# Patient Record
Sex: Male | Born: 1970 | Race: Black or African American | Hispanic: No | Marital: Married | Smoking: Former smoker
Health system: Southern US, Community
[De-identification: ages and names within clinical notes are randomized; demographics above are authoritative.]

## PROBLEM LIST (undated history)

## (undated) DIAGNOSIS — I10 Essential (primary) hypertension: Secondary | ICD-10-CM

## (undated) DIAGNOSIS — E119 Type 2 diabetes mellitus without complications: Secondary | ICD-10-CM

## (undated) DIAGNOSIS — E785 Hyperlipidemia, unspecified: Secondary | ICD-10-CM

## (undated) DIAGNOSIS — F410 Panic disorder [episodic paroxysmal anxiety] without agoraphobia: Secondary | ICD-10-CM

## (undated) DIAGNOSIS — J45909 Unspecified asthma, uncomplicated: Secondary | ICD-10-CM

## (undated) DIAGNOSIS — T7840XA Allergy, unspecified, initial encounter: Secondary | ICD-10-CM

## (undated) DIAGNOSIS — F419 Anxiety disorder, unspecified: Secondary | ICD-10-CM

## (undated) HISTORY — DX: Allergy, unspecified, initial encounter: T78.40XA

## (undated) HISTORY — DX: Panic disorder (episodic paroxysmal anxiety): F41.0

## (undated) HISTORY — DX: Type 2 diabetes mellitus without complications: E11.9

## (undated) HISTORY — DX: Unspecified asthma, uncomplicated: J45.909

## (undated) HISTORY — DX: Hyperlipidemia, unspecified: E78.5

## (undated) HISTORY — DX: Anxiety disorder, unspecified: F41.9

---

## 2014-06-06 ENCOUNTER — Encounter (HOSPITAL_BASED_OUTPATIENT_CLINIC_OR_DEPARTMENT_OTHER): Payer: Self-pay | Admitting: Emergency Medicine

## 2014-06-06 DIAGNOSIS — Z79899 Other long term (current) drug therapy: Secondary | ICD-10-CM | POA: Insufficient documentation

## 2014-06-06 DIAGNOSIS — I10 Essential (primary) hypertension: Secondary | ICD-10-CM | POA: Insufficient documentation

## 2014-06-06 DIAGNOSIS — Z880 Allergy status to penicillin: Secondary | ICD-10-CM | POA: Insufficient documentation

## 2014-06-06 NOTE — ED Notes (Signed)
Pt states that he has had heart palpitations since he was a teenager but getting worse over the past few weeks

## 2014-06-07 ENCOUNTER — Emergency Department (HOSPITAL_BASED_OUTPATIENT_CLINIC_OR_DEPARTMENT_OTHER)
Admission: EM | Admit: 2014-06-07 | Discharge: 2014-06-07 | Disposition: A | Discharge status: Home / Self Care | Payer: Self-pay | Attending: Emergency Medicine | Admitting: Emergency Medicine

## 2014-06-07 ENCOUNTER — Emergency Department (HOSPITAL_BASED_OUTPATIENT_CLINIC_OR_DEPARTMENT_OTHER): Payer: Self-pay

## 2014-06-07 DIAGNOSIS — I10 Essential (primary) hypertension: Secondary | ICD-10-CM

## 2014-06-07 HISTORY — DX: Essential (primary) hypertension: I10

## 2014-06-07 LAB — BASIC METABOLIC PANEL
ANION GAP: 8 (ref 5–15)
BUN: 11 mg/dL (ref 6–23)
CALCIUM: 9.3 mg/dL (ref 8.4–10.5)
CHLORIDE: 103 mmol/L (ref 96–112)
CO2: 27 mmol/L (ref 19–32)
CREATININE: 1.16 mg/dL (ref 0.50–1.35)
GFR, EST AFRICAN AMERICAN: 87 mL/min — AB (ref >90)
GFR, EST NON AFRICAN AMERICAN: 75 mL/min — AB (ref >90)
Glucose, Bld: 121 mg/dL — ABNORMAL HIGH (ref 70–99)
Potassium: 3.4 mmol/L — ABNORMAL LOW (ref 3.5–5.1)
Sodium: 138 mmol/L (ref 135–145)

## 2014-06-07 LAB — CBC WITH DIFFERENTIAL/PLATELET
BASOS ABS: 0 10*3/uL (ref 0.0–0.1)
Basophils Relative: 0 % (ref 0–1)
EOS PCT: 2 % (ref 0–5)
Eosinophils Absolute: 0.2 10*3/uL (ref 0.0–0.7)
HCT: 43 % (ref 39.0–52.0)
Hemoglobin: 14.9 g/dL (ref 13.0–17.0)
LYMPHS ABS: 2.7 10*3/uL (ref 0.7–4.0)
LYMPHS PCT: 31 % (ref 12–46)
MCH: 30.9 pg (ref 26.0–34.0)
MCHC: 34.7 g/dL (ref 30.0–36.0)
MCV: 89.2 fL (ref 78.0–100.0)
Monocytes Absolute: 0.9 10*3/uL (ref 0.1–1.0)
Monocytes Relative: 10 % (ref 3–12)
NEUTROS ABS: 5.1 10*3/uL (ref 1.7–7.7)
Neutrophils Relative %: 57 % (ref 43–77)
PLATELETS: 187 10*3/uL (ref 150–400)
RBC: 4.82 MIL/uL (ref 4.22–5.81)
RDW: 12.6 % (ref 11.5–15.5)
WBC: 8.9 10*3/uL (ref 4.0–10.5)

## 2014-06-07 LAB — TROPONIN I: Troponin I: <0.03 ng/mL (ref <0.031)

## 2014-06-07 NOTE — Discharge Instructions (Signed)
DASH Eating Plan °DASH stands for "Dietary Approaches to Stop Hypertension." The DASH eating plan is a healthy eating plan that has been shown to reduce high blood pressure (hypertension). Additional health benefits may include reducing the risk of type 2 diabetes mellitus, heart disease, and stroke. The DASH eating plan may also help with weight loss. °WHAT DO I NEED TO KNOW ABOUT THE DASH EATING PLAN? °For the DASH eating plan, you will follow these general guidelines: °· Choose foods with a percent daily value for sodium of less than 5% (as listed on the food label). °· Use salt-free seasonings or herbs instead of table salt or sea salt. °· Check with your health care provider or pharmacist before using salt substitutes. °· Eat lower-sodium products, often labeled as "lower sodium" or "no salt added." °· Eat fresh foods. °· Eat more vegetables, fruits, and low-fat dairy products. °· Choose whole grains. Look for the word "whole" as the first word in the ingredient list. °· Choose fish and skinless chicken or turkey more often than red meat. Limit fish, poultry, and meat to 6 oz (170 g) each day. °· Limit sweets, desserts, sugars, and sugary drinks. °· Choose heart-healthy fats. °· Limit cheese to 1 oz (28 g) per day. °· Eat more home-cooked food and less restaurant, buffet, and fast food. °· Limit fried foods. °· Cook foods using methods other than frying. °· Limit canned vegetables. If you do use them, rinse them well to decrease the sodium. °· When eating at a restaurant, ask that your food be prepared with less salt, or no salt if possible. °WHAT FOODS CAN I EAT? °Seek help from a dietitian for individual calorie needs. °Grains °Whole grain or whole wheat bread. Brown rice. Whole grain or whole wheat pasta. Quinoa, bulgur, and whole grain cereals. Low-sodium cereals. Corn or whole wheat flour tortillas. Whole grain cornbread. Whole grain crackers. Low-sodium crackers. °Vegetables °Fresh or frozen vegetables  (raw, steamed, roasted, or grilled). Low-sodium or reduced-sodium tomato and vegetable juices. Low-sodium or reduced-sodium tomato sauce and paste. Low-sodium or reduced-sodium canned vegetables.  °Fruits °All fresh, canned (in natural juice), or frozen fruits. °Meat and Other Protein Products °Ground beef (85% or leaner), grass-fed beef, or beef trimmed of fat. Skinless chicken or turkey. Ground chicken or turkey. Pork trimmed of fat. All fish and seafood. Eggs. Dried beans, peas, or lentils. Unsalted nuts and seeds. Unsalted canned beans. °Dairy °Low-fat dairy products, such as skim or 1% milk, 2% or reduced-fat cheeses, low-fat ricotta or cottage cheese, or plain low-fat yogurt. Low-sodium or reduced-sodium cheeses. °Fats and Oils °Tub margarines without trans fats. Light or reduced-fat mayonnaise and salad dressings (reduced sodium). Avocado. Safflower, olive, or canola oils. Natural peanut or almond butter. °Other °Unsalted popcorn and pretzels. °The items listed above may not be a complete list of recommended foods or beverages. Contact your dietitian for more options. °WHAT FOODS ARE NOT RECOMMENDED? °Grains °White bread. White pasta. White rice. Refined cornbread. Bagels and croissants. Crackers that contain trans fat. °Vegetables °Creamed or fried vegetables. Vegetables in a cheese sauce. Regular canned vegetables. Regular canned tomato sauce and paste. Regular tomato and vegetable juices. °Fruits °Dried fruits. Canned fruit in light or heavy syrup. Fruit juice. °Meat and Other Protein Products °Fatty cuts of meat. Ribs, chicken wings, bacon, sausage, bologna, salami, chitterlings, fatback, hot dogs, bratwurst, and packaged luncheon meats. Salted nuts and seeds. Canned beans with salt. °Dairy °Whole or 2% milk, cream, half-and-half, and cream cheese. Whole-fat or sweetened yogurt. Full-fat   cheeses or blue cheese. Nondairy creamers and whipped toppings. Processed cheese, cheese spreads, or cheese  curds. Condiments Onion and garlic salt, seasoned salt, table salt, and sea salt. Canned and packaged gravies. Worcestershire sauce. Tartar sauce. Barbecue sauce. Teriyaki sauce. Soy sauce, including reduced sodium. Steak sauce. Fish sauce. Oyster sauce. Cocktail sauce. Horseradish. Ketchup and mustard. Meat flavorings and tenderizers. Bouillon cubes. Hot sauce. Tabasco sauce. Marinades. Taco seasonings. Relishes. Fats and Oils Butter, stick margarine, lard, shortening, ghee, and bacon fat. Coconut, palm kernel, or palm oils. Regular salad dressings. Other Pickles and olives. Salted popcorn and pretzels. The items listed above may not be a complete list of foods and beverages to avoid. Contact your dietitian for more information. WHERE CAN I FIND MORE INFORMATION? National Heart, Lung, and Blood Institute: CablePromo.itwww.nhlbi.nih.gov/health/health-topics/topics/dash/ Document Released: 03/27/2011 Document Revised: 08/22/2013 Document Reviewed: 02/09/2013 [facility] Patient Information 2015 [place], [place]. This information is not intended to replace advice given to you by your health care provider. Make sure you discuss any questions you have with your health care provider.  How to Take Your Blood Pressure HOW DO I GET A BLOOD PRESSURE MACHINE?  You can buy an electronic home blood pressure machine at your local pharmacy. Insurance will sometimes cover the cost if you have a prescription.  Ask your doctor what type of machine is best for you. There are different machines for your arm and your wrist.  If you decide to buy a machine to check your blood pressure on your arm, first check the size of your arm so you can buy the right size cuff. To check the size of your arm:   Use a measuring tape that shows both inches and centimeters.   Wrap the measuring tape around the upper-middle part of your arm. You may need someone to help you measure.   Write down your arm measurement in both inches and  centimeters.   To measure your blood pressure correctly, it is important to have the right size cuff.   If your arm is up to 13 inches (up to 34 centimeters), get an adult cuff size.  If your arm is 13 to 17 inches (35 to 44 centimeters), get a large adult cuff size.    If your arm is 17 to 20 inches (45 to 52 centimeters), get an adult thigh cuff.  WHAT DO THE NUMBERS MEAN?   There are two numbers that make up your blood pressure. For example: 120/80.  The first number (120 in our example) is called the "systolic pressure." It is a measure of the pressure in your blood vessels when your heart is pumping blood.  The second number (80 in our example) is called the "diastolic pressure." It is a measure of the pressure in your blood vessels when your heart is resting between beats.  Your doctor will tell you what your blood pressure should be. WHAT SHOULD I DO BEFORE I CHECK MY BLOOD PRESSURE?   Try to rest or relax for at least 30 minutes before you check your blood pressure.  Do not smoke.  Do not have any drinks with caffeine, such as:  Soda.  Coffee.  Tea.  Check your blood pressure in a quiet room.  Sit down and stretch out your arm on a table. Keep your arm at about the level of your heart. Let your arm relax.  Make sure that your legs are not crossed. HOW DO I CHECK MY BLOOD PRESSURE?  Follow the directions that came with  your machine.  Make sure you remove any tight-fighting clothing from your arm or wrist. Wrap the cuff around your upper arm or wrist. You should be able to fit a finger between the cuff and your arm. If you cannot fit a finger between the cuff and your arm, it is too tight and should be removed and rewrapped.  Some units require you to manually pump up the arm cuff.  Automatic units inflate the cuff when you press a button.  Cuff deflation is automatic in both models.  After the cuff is inflated, the unit measures your blood pressure and  pulse. The readings are shown on a monitor. Hold still and breathe normally while the cuff is inflated.  Getting a reading takes less than a minute.  Some models store readings in a memory. Some provide a printout of readings. If your machine does not store your readings, keep a written record.  Take readings with you to your next visit with your doctor. Document Released: 03/20/2008 Document Revised: 08/22/2013 Document Reviewed: 06/02/2013 [facility]Exit[facility] Patient Information 2015 [place]Care, [place]LLC. This information is not intended to replace advice given to you by your health care provider. Make sure you discuss any questions you have with your health care provider.

## 2014-06-07 NOTE — ED Provider Notes (Signed)
CSN: [id number]     Arrival date & time 06/06/14  2054 History   First MD Initiated Contact with Patient 06/07/14 0032     Chief Complaint  Patient presents with  . Palpitations     (Consider location/radiation/quality/duration/timing/severity/associated sxs/prior Treatment) Patient is a 44 y.o. male presenting with palpitations. The history is provided by the patient.  Palpitations Onset quality:  Sudden Timing:  Unable to specify Progression:  Worsening Chronicity:  Chronic Context: anxiety   Context: not appetite suppressants   Context comment:  Awakens from sleep in a panic also feels bhe wakes himself with his snoring Relieved by:  Nothing Worsened by:  Nothing Ineffective treatments:  None tried Associated symptoms: no back pain, no chest pain, no chest pressure, no cough, no diaphoresis, no hemoptysis, no leg pain, no lower extremity edema, no malaise/fatigue, no nausea, no near-syncope, no numbness, no orthopnea, no PND, no shortness of breath, no syncope, no vomiting and no weakness   Risk factors: no diabetes mellitus, no hx of atrial fibrillation, no hx of PE and no hypercoagulable state     Past Medical History  Diagnosis Date  . Hypertension    History reviewed. No pertinent past surgical history. History reviewed. No pertinent family history. History  Substance Use Topics  . Smoking status: Never Smoker   . Smokeless tobacco: Not on file  . Alcohol Use: Yes    Review of Systems  Constitutional: Negative for fever, malaise/fatigue, diaphoresis and unexpected weight change.  Respiratory: Negative for cough, hemoptysis, choking, chest tightness, shortness of breath and wheezing.   Cardiovascular: Positive for palpitations. Negative for chest pain, orthopnea, leg swelling, syncope, PND and near-syncope.  Gastrointestinal: Negative for nausea and vomiting.  Musculoskeletal: Negative for back pain.  Neurological: Negative for seizures, syncope, facial asymmetry,  speech difficulty, weakness and numbness.  Hematological: Negative for adenopathy. Does not bruise/bleed easily.  All other systems reviewed and are negative.     Allergies  Penicillins  Home Medications   Prior to Admission medications   Medication Sig Start Date End Date Taking? Authorizing Provider  hydrochlorothiazide (HYDRODIURIL) 25 MG tablet Take 25 mg by mouth daily.   Yes Historical Provider, MD  metoprolol tartrate (LOPRESSOR) 25 MG tablet Take 50 mg by mouth 2 (two) times daily.   Yes Historical Provider, MD   BP 160/104 mmHg  Pulse 84  Resp 20  Ht 6' (1.829 m)  Wt 219 lb (99.338 kg)  BMI 29.70 kg/m2  SpO2 98% Physical Exam  Constitutional: He is oriented to person, place, and time. He appears well-developed and well-nourished. No distress.  HENT:  Head: Normocephalic and atraumatic.  Mouth/Throat: Oropharynx is clear and moist.  Eyes: EOM are normal. Pupils are equal, round, and reactive to light.  Neck: Normal range of motion. Neck supple.  No supraclavicular or axillary LAN  Cardiovascular: Normal rate, regular rhythm and intact distal pulses.   Pulmonary/Chest: Effort normal and breath sounds normal. No stridor. No respiratory distress. He has no wheezes. He has no rales. He exhibits no tenderness.  Abdominal: Soft. Bowel sounds are normal. There is no tenderness. There is no rebound and no guarding.  Musculoskeletal: Normal range of motion. He exhibits no edema or tenderness.  Lymphadenopathy:    He has no cervical adenopathy.  Neurological: He is alert and oriented to person, place, and time. He has normal reflexes. He displays normal reflexes. No cranial nerve deficit. He exhibits normal muscle tone.  Skin: Skin is warm and dry.  ED Course  Procedures (including critical care time) Labs Review Labs Reviewed  CBC WITH DIFFERENTIAL/PLATELET  BASIC METABOLIC PANEL  TROPONIN I    Imaging Review Dg Chest 2 View  06/07/2014   CLINICAL DATA:  Initial  evaluation for shortness of Breath. History of hypertension.  EXAM: CHEST  2 VIEW  COMPARISON:  None.  FINDINGS: The cardiac and mediastinal silhouettes are within normal limits.  The lungs are normally inflated. Mild elevation of left hemidiaphragm noted. No airspace consolidation, pleural effusion, or pulmonary edema is identified. There is no pneumothorax.  No acute osseous abnormality identified.  IMPRESSION: No active cardiopulmonary disease.   Electronically Signed   By: Rise Mu M.D.   On: 06/07/2014 01:14     EKG Interpretation   Date/Time:  Tuesday June 06 2014 21:11:50 EST Ventricular Rate:  89 PR Interval:  160 QRS Duration: 80 QT Interval:  362 QTC Calculation: 440 R Axis:   78 Text Interpretation:  Normal sinus rhythm Normal ECG Sinus rhythm Normal  ECG Confirmed by Gerhard Munch  MD ([id number]) on 06/06/2014 9:15:51 PM      MDM   Final diagnoses:  Hypertension      Visual Acuity  Right Eye Distance: 20/25 Left Eye Distance: 20/25 Bilateral Distance: 20/25 (Patient wears perscription lenses)  Right Eye Near:   Left Eye Near:    Bilateral Near:     PERC negative wells 0. Ruled out for MI based on labs and normal EKG no PVC or arrhythmia on monitor.  Will need to see PMD for BP med adjustment and annual exam.  Strict chest pain return precautions given.    Jasmine Awe, MD 06/07/14 [phone]

## 2014-06-19 ENCOUNTER — Ambulatory Visit (INDEPENDENT_AMBULATORY_CARE_PROVIDER_SITE_OTHER): Payer: Self-pay | Admitting: Family Medicine

## 2014-06-19 ENCOUNTER — Encounter: Payer: Self-pay | Admitting: Family Medicine

## 2014-06-19 ENCOUNTER — Ambulatory Visit: Payer: Self-pay | Admitting: Family Medicine

## 2014-06-19 VITALS — BP 138/90 | HR 73 | Temp 99.4°F | Resp 16 | Ht 71.75 in | Wt 213.0 lb

## 2014-06-19 DIAGNOSIS — F410 Panic disorder [episodic paroxysmal anxiety] without agoraphobia: Secondary | ICD-10-CM

## 2014-06-19 DIAGNOSIS — I10 Essential (primary) hypertension: Secondary | ICD-10-CM

## 2014-06-19 DIAGNOSIS — E785 Hyperlipidemia, unspecified: Secondary | ICD-10-CM

## 2014-06-19 HISTORY — DX: Essential (primary) hypertension: I10

## 2014-06-19 MED ORDER — ALPRAZOLAM 0.25 MG PO TABS: 0.2500 mg | ORAL_TABLET | Freq: Two times a day (BID) | ORAL | Status: DC | PRN

## 2014-06-19 NOTE — Patient Instructions (Signed)
Please keep in touch with me regarding your blood pressure.  Hopefully with continued diet and exercise changes we will see your BP continue to come down.   We would like to see your BP around 130/85 or less- let me know if it continued to run higher than this Please come by for a blood test only in about 3-4 months (fasting please) and we will follow-up your cholesterol  You may use the xanax as needed for panic attacks- of course the less you use this the better, but it is there  If you need it.

## 2014-06-19 NOTE — Progress Notes (Signed)
[facility] [address], [place] [address] [phone]  Date:  06/19/2014   Name:  Cameron Wells   DOB:  05-09-1970   MRN:  [id number]  PCP:  No primary care provider on file.    Chief Complaint: Establish Care and Hypertension   History of Present Illness:  Cameron FolkOmah Tremblay is a 44 y.o. very pleasant male patient who presents with the following:  Here today to establish care as a new patient.  He was seen in the ER on 2/17 with palpitations.  He was ruled out for MI based on labs and normal EKG.  He was asked to see PCP to discuss his BP/ health maintenance.    He is on HCTZ and lopressor 25; he was on these meds at his ER visit.  He has had HTN for about 6 years now.  He is really watching his diet right now and avoiding salt- this is tough but he is really trying and has already lost some weight since his ED visit.  He was told by his last PCP that he might need a statin- however he has been afraid to try this.  He is not sure what his numbers were but he will try to get this to me.  He does have a copy at home.    He feels that his anxiety/ panic is a lot better since he was in the hospital.  He has had this problem off and on for a while.  He has never tried anything for panic but would be ok with having a prn xanax on hand for emergencies.    He has a serious GF, they have a 8010 month old daughter together.    BP Readings from Last 3 Encounters:  06/19/14 138/90  06/07/14 164/105     There are no active problems to display for this patient.   Past Medical History  Diagnosis Date  . Hypertension   . Allergy   . Asthma   . Anxiety     History reviewed. No pertinent past surgical history.  History  Substance Use Topics  . Smoking status: Former Games developermoker  . Smokeless tobacco: Not on file  . Alcohol Use: 0.0 oz/week    0 Standard drinks or equivalent per week    History reviewed. No pertinent family history.  Allergies  Allergen Reactions  .  Penicillins Anaphylaxis    Medication list has been reviewed and updated.  Current Outpatient Prescriptions on File Prior to Visit  Medication Sig Dispense Refill  . hydrochlorothiazide (HYDRODIURIL) 25 MG tablet Take 25 mg by mouth daily.    . metoprolol tartrate (LOPRESSOR) 25 MG tablet Take 50 mg by mouth 2 (two) times daily.     No current facility-administered medications on file prior to visit.    Review of Systems:  As per HPI- otherwise negative.   Physical Examination: Filed Vitals:   06/19/14 1044  BP: 138/90  Pulse: 73  Temp: 99.4 F (37.4 C)  Resp: 16   Filed Vitals:   06/19/14 1044  Height: 5' 11.75" (1.822 m)  Weight: 213 lb (96.616 kg)   Body mass index is 29.1 kg/(m^2). Ideal Body Weight: Weight in (lb) to have BMI = 25: 182.7  GEN: WDWN, NAD, Non-toxic, A & O x 3, overweight, looks well HEENT: Atraumatic, Normocephalic. Neck supple. No masses, No LAD. Ears and Nose: No external deformity. CV: RRR, No M/G/R. No JVD. No thrill. No extra heart sounds. PULM:  CTA B, no wheezes, crackles, rhonchi. No retractions. No resp. distress. No accessory muscle use. EXTR: No c/c/e NEURO Normal gait.  PSYCH: Normally interactive. Conversant. Not depressed or anxious appearing.  Calm demeanor.    Assessment and Plan: Essential hypertension  Panic attack - Plan: ALPRAZolam (XANAX) 0.25 MG tablet  Dyslipidemia - Plan: Lipid panel  BP is acceptable.  He is losing weight and watching his diet.  Will leave his medications as are for now.  He will come by for fasting lipids in 3-4 months (lab visit only) and will keep me posted about home BP via mychart.  He is not able to afford insurance right now although he has looked into his options Gave #10 xanax to have on hand in case of panic attack.  He will use these conservatively.   Signed Abbe Amsterdam, MD

## 2014-08-10 ENCOUNTER — Ambulatory Visit: Payer: Self-pay

## 2014-08-10 ENCOUNTER — Ambulatory Visit (INDEPENDENT_AMBULATORY_CARE_PROVIDER_SITE_OTHER): Payer: Self-pay

## 2014-08-10 ENCOUNTER — Ambulatory Visit (INDEPENDENT_AMBULATORY_CARE_PROVIDER_SITE_OTHER): Payer: Self-pay | Admitting: Family Medicine

## 2014-08-10 VITALS — BP 140/108 | HR 98 | Temp 98.3°F | Resp 16 | Ht 71.75 in | Wt 215.0 lb

## 2014-08-10 DIAGNOSIS — R0602 Shortness of breath: Secondary | ICD-10-CM

## 2014-08-10 DIAGNOSIS — F410 Panic disorder [episodic paroxysmal anxiety] without agoraphobia: Secondary | ICD-10-CM

## 2014-08-10 DIAGNOSIS — F4323 Adjustment disorder with mixed anxiety and depressed mood: Secondary | ICD-10-CM

## 2014-08-10 MED ORDER — ALBUTEROL SULFATE HFA 108 (90 BASE) MCG/ACT IN AERS: 2.0000 | INHALATION_SPRAY | Freq: Four times a day (QID) | RESPIRATORY_TRACT | Status: DC | PRN

## 2014-08-10 MED ORDER — ALPRAZOLAM 0.5 MG PO TABS: 0.2500 mg | ORAL_TABLET | Freq: Two times a day (BID) | ORAL | Status: DC | PRN

## 2014-08-10 MED ORDER — FLUOXETINE HCL 20 MG PO TABS: 20.0000 mg | ORAL_TABLET | Freq: Every day | ORAL | Status: DC

## 2014-08-10 NOTE — Patient Instructions (Signed)
We will start you on prozac for more persistent anxiety. Take the 20 mg a day- then increase to 40 mg after 2 weeks Continue to use the xanax as needed; remember this can be habit forming  Try the albuterol as needed for your shortness of breath.  If this does not help however please let me know and we may order that D dimer test

## 2014-08-10 NOTE — Progress Notes (Signed)
[facility] [address], [place] [address] [phone]  Date:  08/10/2014   Name:  Cameron Wells   DOB:  1971-02-13   MRN:  [id number]  PCP:  No primary care provider on file.    Chief Complaint: Shortness of Breath   History of Present Illness:  Cameron Wells is a 44 y.o. very pleasant male patient who presents with the following:  History of HTN.  Here today with illness.  His BP is looking much better with his medication but still can sometimes be a bit hihg He is also using xanax on occasion for panic.  This does seem to help him a lot; however he only received #10 from the ER and has used them up.  He is generally taking 2 (total of 0.5mg )  He notes some difficulty with his breathing for the last couple of weeks-  "my lungs feel like there is fluid" in them.  This seems to get worse at night when he lays down but can happen other times as well.  He also feels more SOB with tasks at work.  This seems different than his usual anxiety sx.  Admits that he is feeling anxious most of the time recently.  He does not feel that he is depressed.  He described a history of anxiety really his whole life off and on.  "when I was a teenager my mom wanted to have me on xanax."  He has never used any other medication for anxiety or depression He is not coughing.   He is a former smoker- quit in 2011.  He does vape still however.  He has not noted any wheezing.   He feels like his lungs are "full of air already and it is hard to get the air out to take another breath."     No fever.  He did have a ST yesterday.   He has noted some sneezing, itchy eyes and runny nose, he is using allegra D daily.   He has no history of DVT or PE; he is not aware of any family history of clot, no travel recently.    BP Readings from Last 3 Encounters:  08/10/14 140/98  06/19/14 138/90  06/07/14 164/105      Patient Active Problem List   Diagnosis Date Noted  . Essential hypertension  06/19/2014    Past Medical History  Diagnosis Date  . Hypertension   . Allergy   . Asthma   . Anxiety     No past surgical history on file.  History  Substance Use Topics  . Smoking status: Former Games developermoker  . Smokeless tobacco: Not on file  . Alcohol Use: 0.0 oz/week    0 Standard drinks or equivalent per week    No family history on file.  Allergies  Allergen Reactions  . Penicillins Anaphylaxis    Medication list has been reviewed and updated.  Current Outpatient Prescriptions on File Prior to Visit  Medication Sig Dispense Refill  . ALPRAZolam (XANAX) 0.25 MG tablet Take 1 tablet (0.25 mg total) by mouth 2 (two) times daily as needed for anxiety. 10 tablet 0  . hydrochlorothiazide (HYDRODIURIL) 25 MG tablet Take 25 mg by mouth daily.    . metoprolol tartrate (LOPRESSOR) 25 MG tablet Take 50 mg by mouth 2 (two) times daily.     No current facility-administered medications on file prior to visit.    Review of Systems:  As per HPI- otherwise negative.  Physical Examination: Filed Vitals:   08/10/14 1250  BP: 140/98  Pulse: 82  Temp: 98.3 F (36.8 C)  Resp: 16   Filed Vitals:   08/10/14 1250  Height: 5' 11.75" (1.822 m)  Weight: 215 lb (97.523 kg)   Body mass index is 29.38 kg/(m^2). Ideal Body Weight: Weight in (lb) to have BMI = 25: 182.7  GEN: WDWN, NAD, Non-toxic, A & O x 3, large build/ overweight.  Looks well HEENT: Atraumatic, Normocephalic. Neck supple. No masses, No LAD.  Bilateral TM wnl, oropharynx normal.  PEERL,EOMI.   Ears and Nose: No external deformity. CV: RRR, No M/G/R. No JVD. No thrill. No extra heart sounds. PULM: CTA B, no wheezes, crackles, rhonchi. No retractions. No resp. distress. No accessory muscle use. EXTR: No c/c/e NEURO Normal gait.  PSYCH: Normally interactive. Conversant. Not depressed or anxious appearing.  Calm demeanor.  No calf tenderness or swelling  [facility] reading (PRIMARY) by  Dr. Patsy Lager. CXR: negative.    CHEST 2 VIEW  COMPARISON: PA and lateral chest x-ray of June 07, 2014  FINDINGS: The lungs are adequately inflated and clear. The heart and pulmonary vascularity are normal. The mediastinum is normal in width. There is no pleural effusion. The bony thorax is unremarkable.  IMPRESSION: There is no active cardiopulmonary disease.  Assessment and Plan: SOB (shortness of breath) - Plan: DG Chest 2 View, albuterol (PROVENTIL HFA;VENTOLIN HFA) 108 (90 BASE) MCG/ACT inhaler  Adjustment disorder with mixed anxiety and depressed mood - Plan: FLUoxetine (PROZAC) 20 MG tablet  Panic attack - Plan: ALPRAZolam (XANAX) 0.5 MG tablet  Here today with SOB for a couple of weeks.  Reasured that his CXR looks fine.  He related that he did use albuterol when he was a youth for similar sx.  However he has not used anything like this recently.  SOB may be due to RAD/ allergies, anxiety, less likely PE.  Discussed PE with him; his VS do not suggest a PE.  We can certainly do a d dimer but false positives are common.  If the dimer is positive we will need to do a CT angio.  He declines this for now but will keep me up to date on his sx If not feeling better will proceed with dimer Refilled his xanax and increased to 0.5mg  dose. Also start on prozac for more baseline anxiety.   He plans to see me in July for a recheck  Signed Abbe Amsterdam, MD

## 2014-10-30 ENCOUNTER — Ambulatory Visit (INDEPENDENT_AMBULATORY_CARE_PROVIDER_SITE_OTHER): Payer: Self-pay | Admitting: Family Medicine

## 2014-10-30 ENCOUNTER — Encounter: Payer: Self-pay | Admitting: Family Medicine

## 2014-10-30 ENCOUNTER — Telehealth: Payer: Self-pay | Admitting: *Deleted

## 2014-10-30 VITALS — BP 145/105 | HR 71 | Temp 98.2°F | Resp 16 | Ht 72.0 in | Wt 205.6 lb

## 2014-10-30 DIAGNOSIS — E785 Hyperlipidemia, unspecified: Secondary | ICD-10-CM

## 2014-10-30 DIAGNOSIS — I10 Essential (primary) hypertension: Secondary | ICD-10-CM

## 2014-10-30 DIAGNOSIS — F4323 Adjustment disorder with mixed anxiety and depressed mood: Secondary | ICD-10-CM

## 2014-10-30 DIAGNOSIS — F410 Panic disorder [episodic paroxysmal anxiety] without agoraphobia: Secondary | ICD-10-CM

## 2014-10-30 LAB — BASIC METABOLIC PANEL
BUN: 12 mg/dL (ref 6–23)
CO2: 28 mEq/L (ref 19–32)
Calcium: 9.6 mg/dL (ref 8.4–10.5)
Chloride: 102 mEq/L (ref 96–112)
Creat: 1.21 mg/dL (ref 0.50–1.35)
GLUCOSE: 106 mg/dL — AB (ref 70–99)
POTASSIUM: 4.5 meq/L (ref 3.5–5.3)
Sodium: 141 mEq/L (ref 135–145)

## 2014-10-30 LAB — LIPID PANEL
CHOL/HDL RATIO: 8 ratio
Cholesterol: 296 mg/dL — ABNORMAL HIGH (ref 0–200)
HDL: 37 mg/dL — ABNORMAL LOW (ref >=40)
LDL Cholesterol: 216 mg/dL — ABNORMAL HIGH (ref 0–99)
Triglycerides: 215 mg/dL — ABNORMAL HIGH (ref <150)
VLDL: 43 mg/dL — ABNORMAL HIGH (ref 0–40)

## 2014-10-30 MED ORDER — FLUOXETINE HCL 40 MG PO CAPS: 40.0000 mg | ORAL_CAPSULE | Freq: Every day | ORAL | Status: DC

## 2014-10-30 MED ORDER — ALPRAZOLAM 0.5 MG PO TABS: 0.2500 mg | ORAL_TABLET | Freq: Two times a day (BID) | ORAL | Status: DC | PRN

## 2014-10-30 NOTE — Telephone Encounter (Signed)
Called prescription to patient's pharmacy for Alprazolam 0.5 mg tablet, per Dr Patsy Lageropland. Pharmacist advised patient will pick up Rx after leaving the office.

## 2014-10-30 NOTE — Progress Notes (Addendum)
[facility] [address] [place] [address] [phone]  Date:  10/30/2014   Name:  Cameron Wells   DOB:  02-22-1971   MRN:  [id number]  PCP:  No primary care provider on file.    Chief Complaint: Hypertension   History of Present Illness:  Cameron Wells is a 44 y.o. very pleasant male patient who presents with the following:  Here today for a periodic BP recheck.  He was in the ER back in February with a panic attack and uncontrolled BP.  He is taking lopressor, HCTZ 25, prozac and prn xanax BP Readings from Last 3 Encounters:  10/30/14 142/104  08/10/14 140/108  06/19/14 138/90   He has lost weight.  He feels like his mood is "balanced."  He may occasionally have panic sx but he uses a xanax as needed He takes xanax on average 3x a week His main concern is that his prozac is costing him $120 a month!   Wt Readings from Last 3 Encounters:  10/30/14 205 lb 9.6 oz (93.26 kg)  08/10/14 215 lb (97.523 kg)  06/19/14 213 lb (96.616 kg)      Patient Active Problem List   Diagnosis Date Noted  . Adjustment disorder with mixed anxiety and depressed mood 08/10/2014  . Panic attack 08/10/2014  . Essential hypertension 06/19/2014    Past Medical History  Diagnosis Date  . Hypertension   . Allergy   . Asthma   . Anxiety     No past surgical history on file.  History  Substance Use Topics  . Smoking status: Former Games developer  . Smokeless tobacco: Not on file  . Alcohol Use: 0.0 oz/week    0 Standard drinks or equivalent per week    No family history on file.  Allergies  Allergen Reactions  . Penicillins Anaphylaxis    Medication list has been reviewed and updated.  Current Outpatient Prescriptions on File Prior to Visit  Medication Sig Dispense Refill  . albuterol (PROVENTIL HFA;VENTOLIN HFA) 108 (90 BASE) MCG/ACT inhaler Inhale 2 puffs into the lungs every 6 (six) hours as needed for wheezing or shortness of breath. 1 Inhaler 2  . ALPRAZolam  (XANAX) 0.5 MG tablet Take 0.5 tablets (0.25 mg total) by mouth 2 (two) times daily as needed for anxiety. 30 tablet 0  . fexofenadine-pseudoephedrine (ALLEGRA-D 24) 180-240 MG per 24 hr tablet Take 1 tablet by mouth daily.    Marland Kitchen FLUoxetine (PROZAC) 20 MG tablet Take 1 tablet (20 mg total) by mouth daily. Increase to 40 mg after 2 weeks 60 tablet 3  . hydrochlorothiazide (HYDRODIURIL) 25 MG tablet Take 25 mg by mouth daily.    . metoprolol tartrate (LOPRESSOR) 25 MG tablet Take 50 mg by mouth 2 (two) times daily.    Marland Kitchen omeprazole (PRILOSEC OTC) 20 MG tablet Take 20 mg by mouth daily.     No current facility-administered medications on file prior to visit.    Review of Systems:  As per HPI- otherwise negative.   Physical Examination: Filed Vitals:   10/30/14 1014  BP: 142/104  Pulse: 71  Temp: 98.2 F (36.8 C)  Resp: 16   Filed Vitals:   10/30/14 1014  Height: 6' (1.829 m)  Weight: 205 lb 9.6 oz (93.26 kg)   Body mass index is 27.88 kg/(m^2). Ideal Body Weight: Weight in (lb) to have BMI = 25: 183.9  GEN: WDWN, NAD, Non-toxic, A & O x 3, mild overweight, looks well  HEENT: Atraumatic, Normocephalic. Neck supple. No masses, No LAD. Ears and Nose: No external deformity. CV: RRR, No M/G/R. No JVD. No thrill. No extra heart sounds. PULM: CTA B, no wheezes, crackles, rhonchi. No retractions. No resp. distress. No accessory muscle use. EXTR: No c/c/e NEURO Normal gait.  PSYCH: Normally interactive. Conversant. Not depressed or anxious appearing.  Calm demeanor.    Assessment and Plan: Adjustment disorder with mixed anxiety and depressed mood - Plan: FLUoxetine (PROZAC) 40 MG capsule  Dyslipidemia - Plan: Lipid panel  Essential hypertension - Plan: Basic metabolic panel  Panic attack - Plan: ALPRAZolam (XANAX) 0.5 MG tablet  Changed to the prozac 40 mg strength in hopes of saving money Check basic labs He will monitor his BP and let me know if running high- in that case may  change to hctz/lisinopril  Signed Abbe AmsterdamJessica Landynn Dupler, MD  Received his labs 7/12 and gave him a call.  He was fasting for his lab draw.  He does not want to start a cholesterol medication  he will work on his diet and exercise- and plan to recheck in 6 months  Results for orders placed or performed in visit on 10/30/14  Lipid panel  Result Value Ref Range   Cholesterol 296 (H) 0 - 200 mg/dL   Triglycerides 161215 (H) <150 mg/dL   HDL 37 (L) >=09>=40 mg/dL   Total CHOL/HDL Ratio 8.0 Ratio   VLDL 43 (H) 0 - 40 mg/dL   LDL Cholesterol 604216 (H) 0 - 99 mg/dL  Basic metabolic panel  Result Value Ref Range   Sodium 141 135 - 145 mEq/L   Potassium 4.5 3.5 - 5.3 mEq/L   Chloride 102 96 - 112 mEq/L   CO2 28 19 - 32 mEq/L   Glucose, Bld 106 (H) 70 - 99 mg/dL   BUN 12 6 - 23 mg/dL   Creat 5.401.21 9.810.50 - 1.911.35 mg/dL   Calcium 9.6 8.4 - 47.810.5 mg/dL

## 2014-10-30 NOTE — Patient Instructions (Signed)
Please check your BP a few times over the next couple of weeks and let me know if running over 130/85 on a regular basis In that case we may want to change your BP medication slightly Continue to use the xanax just as needed Let me know if the prozac  is not more affordable I will be in touch with your labs asap

## 2014-10-31 ENCOUNTER — Encounter: Payer: Self-pay | Admitting: Family Medicine

## 2015-01-15 ENCOUNTER — Telehealth: Payer: Self-pay

## 2015-01-15 DIAGNOSIS — F410 Panic disorder [episodic paroxysmal anxiety] without agoraphobia: Secondary | ICD-10-CM

## 2015-01-15 MED ORDER — ALPRAZOLAM 0.5 MG PO TABS
0.2500 mg | ORAL_TABLET | Freq: Two times a day (BID) | ORAL | Status: DC | PRN
Start: 2015-01-15 — End: 2017-01-29

## 2015-01-15 NOTE — Telephone Encounter (Signed)
PT IS REQUESTING A REFILL OF metoprolol tartrate (LOPRESSOR) 25 MG tablet, hydrochlorothiazide (HYDRODIURIL) 25 MG tablet, ALPRAZolam (XANAX) 0.5 MG tablet . PLEASE CALL WHEN SENT TO PHARMACY

## 2015-01-15 NOTE — Telephone Encounter (Signed)
Advise on Xanax. 

## 2015-01-30 ENCOUNTER — Telehealth: Payer: Self-pay

## 2015-01-30 MED ORDER — METOPROLOL TARTRATE 25 MG PO TABS: 50.0000 mg | ORAL_TABLET | Freq: Two times a day (BID) | ORAL | Status: DC

## 2015-01-30 MED ORDER — HYDROCHLOROTHIAZIDE 25 MG PO TABS: 25.0000 mg | ORAL_TABLET | Freq: Every day | ORAL | Status: DC

## 2015-01-30 NOTE — Telephone Encounter (Signed)
Rx sent 

## 2015-01-30 NOTE — Telephone Encounter (Signed)
Patient called on 01/15/15 for his medication refills.   He has not had a response on the metoprolol tartrate (LOPRESSOR) 25 MG tablet hydrochlorothiazide (HYDRODIURIL) 25 MG tablet   [phone]

## 2015-01-31 ENCOUNTER — Telehealth: Payer: Self-pay

## 2015-01-31 NOTE — Telephone Encounter (Signed)
Pharmacy-[address], [place] [id number]. #[phone] of pharmacy. Please reference call from from 10/11. He would like a refill on  hydrochlorothiazide (HYDRODIURIL) 25 MG tablet [[id number]][[id number]] and metoprolol tartrate (LOPRESSOR) 25 MG tablet [[id number]][[id number]] DISCONTINUED. Please advise at [phone]

## 2015-02-01 NOTE — Telephone Encounter (Signed)
HCTZ, Metoprolol were sent 01/30/15 he may have to have pharmacy transfer, he will do this.

## 2015-03-22 ENCOUNTER — Other Ambulatory Visit: Payer: Self-pay | Admitting: Physician Assistant

## 2015-05-03 ENCOUNTER — Other Ambulatory Visit: Payer: Self-pay

## 2015-05-03 MED ORDER — METOPROLOL TARTRATE 50 MG PO TABS: 50.0000 mg | ORAL_TABLET | Freq: Two times a day (BID) | ORAL | Status: DC

## 2015-07-03 ENCOUNTER — Other Ambulatory Visit: Payer: Self-pay | Admitting: Family Medicine

## 2015-09-03 ENCOUNTER — Other Ambulatory Visit: Payer: Self-pay | Admitting: Physician Assistant

## 2015-09-03 ENCOUNTER — Other Ambulatory Visit: Payer: Self-pay | Admitting: Family Medicine

## 2015-09-05 ENCOUNTER — Other Ambulatory Visit: Payer: Self-pay | Admitting: Physician Assistant

## 2015-09-14 ENCOUNTER — Other Ambulatory Visit: Payer: Self-pay | Admitting: Physician Assistant

## 2015-09-19 ENCOUNTER — Other Ambulatory Visit: Payer: Self-pay | Admitting: Physician Assistant

## 2015-11-19 ENCOUNTER — Other Ambulatory Visit: Payer: Self-pay | Admitting: Family Medicine

## 2015-11-19 DIAGNOSIS — F4323 Adjustment disorder with mixed anxiety and depressed mood: Secondary | ICD-10-CM

## 2015-11-20 ENCOUNTER — Other Ambulatory Visit: Payer: Self-pay | Admitting: Emergency Medicine

## 2015-11-20 DIAGNOSIS — F4323 Adjustment disorder with mixed anxiety and depressed mood: Secondary | ICD-10-CM

## 2015-11-20 MED ORDER — FLUOXETINE HCL 40 MG PO CAPS: 40.0000 mg | ORAL_CAPSULE | Freq: Every day | ORAL | 1 refills | Status: DC

## 2015-11-20 MED ORDER — METOPROLOL TARTRATE 50 MG PO TABS: 50.0000 mg | ORAL_TABLET | Freq: Two times a day (BID) | ORAL | 1 refills | Status: DC

## 2015-11-20 NOTE — Telephone Encounter (Signed)
Received refill request for Metoprolol and fluoxetine. Pt last seen : 10/30/2014 and last refill: 09/03/2015. Will it be ok to refill or will pt need office visit first. Please advise. Thanks.

## 2016-02-05 ENCOUNTER — Other Ambulatory Visit: Payer: Self-pay | Admitting: Family Medicine

## 2016-02-05 DIAGNOSIS — F4323 Adjustment disorder with mixed anxiety and depressed mood: Secondary | ICD-10-CM

## 2016-02-06 ENCOUNTER — Other Ambulatory Visit: Payer: Self-pay | Admitting: Emergency Medicine

## 2016-02-06 DIAGNOSIS — F4323 Adjustment disorder with mixed anxiety and depressed mood: Secondary | ICD-10-CM

## 2016-02-06 MED ORDER — FLUOXETINE HCL 40 MG PO CAPS: 40.0000 mg | ORAL_CAPSULE | Freq: Every day | ORAL | 0 refills | Status: DC

## 2016-02-06 NOTE — Telephone Encounter (Signed)
Received refill request for FLUoxetine 40MG  CAP. Pt has not been seen since 10/30/2014. Are you ok with refilling this or will the pt need an office visit? Please advise.

## 2016-03-22 ENCOUNTER — Other Ambulatory Visit: Payer: Self-pay | Admitting: Family Medicine

## 2016-03-22 DIAGNOSIS — F4323 Adjustment disorder with mixed anxiety and depressed mood: Secondary | ICD-10-CM

## 2016-04-09 IMAGING — CR DG CHEST 2V
2 series · 2 of 2 positions shown · non-contrast
Comparison: None.

CLINICAL DATA: Initial evaluation for shortness of Breath. History
of hypertension.

EXAM:
CHEST  2 VIEW

[w chest pa]
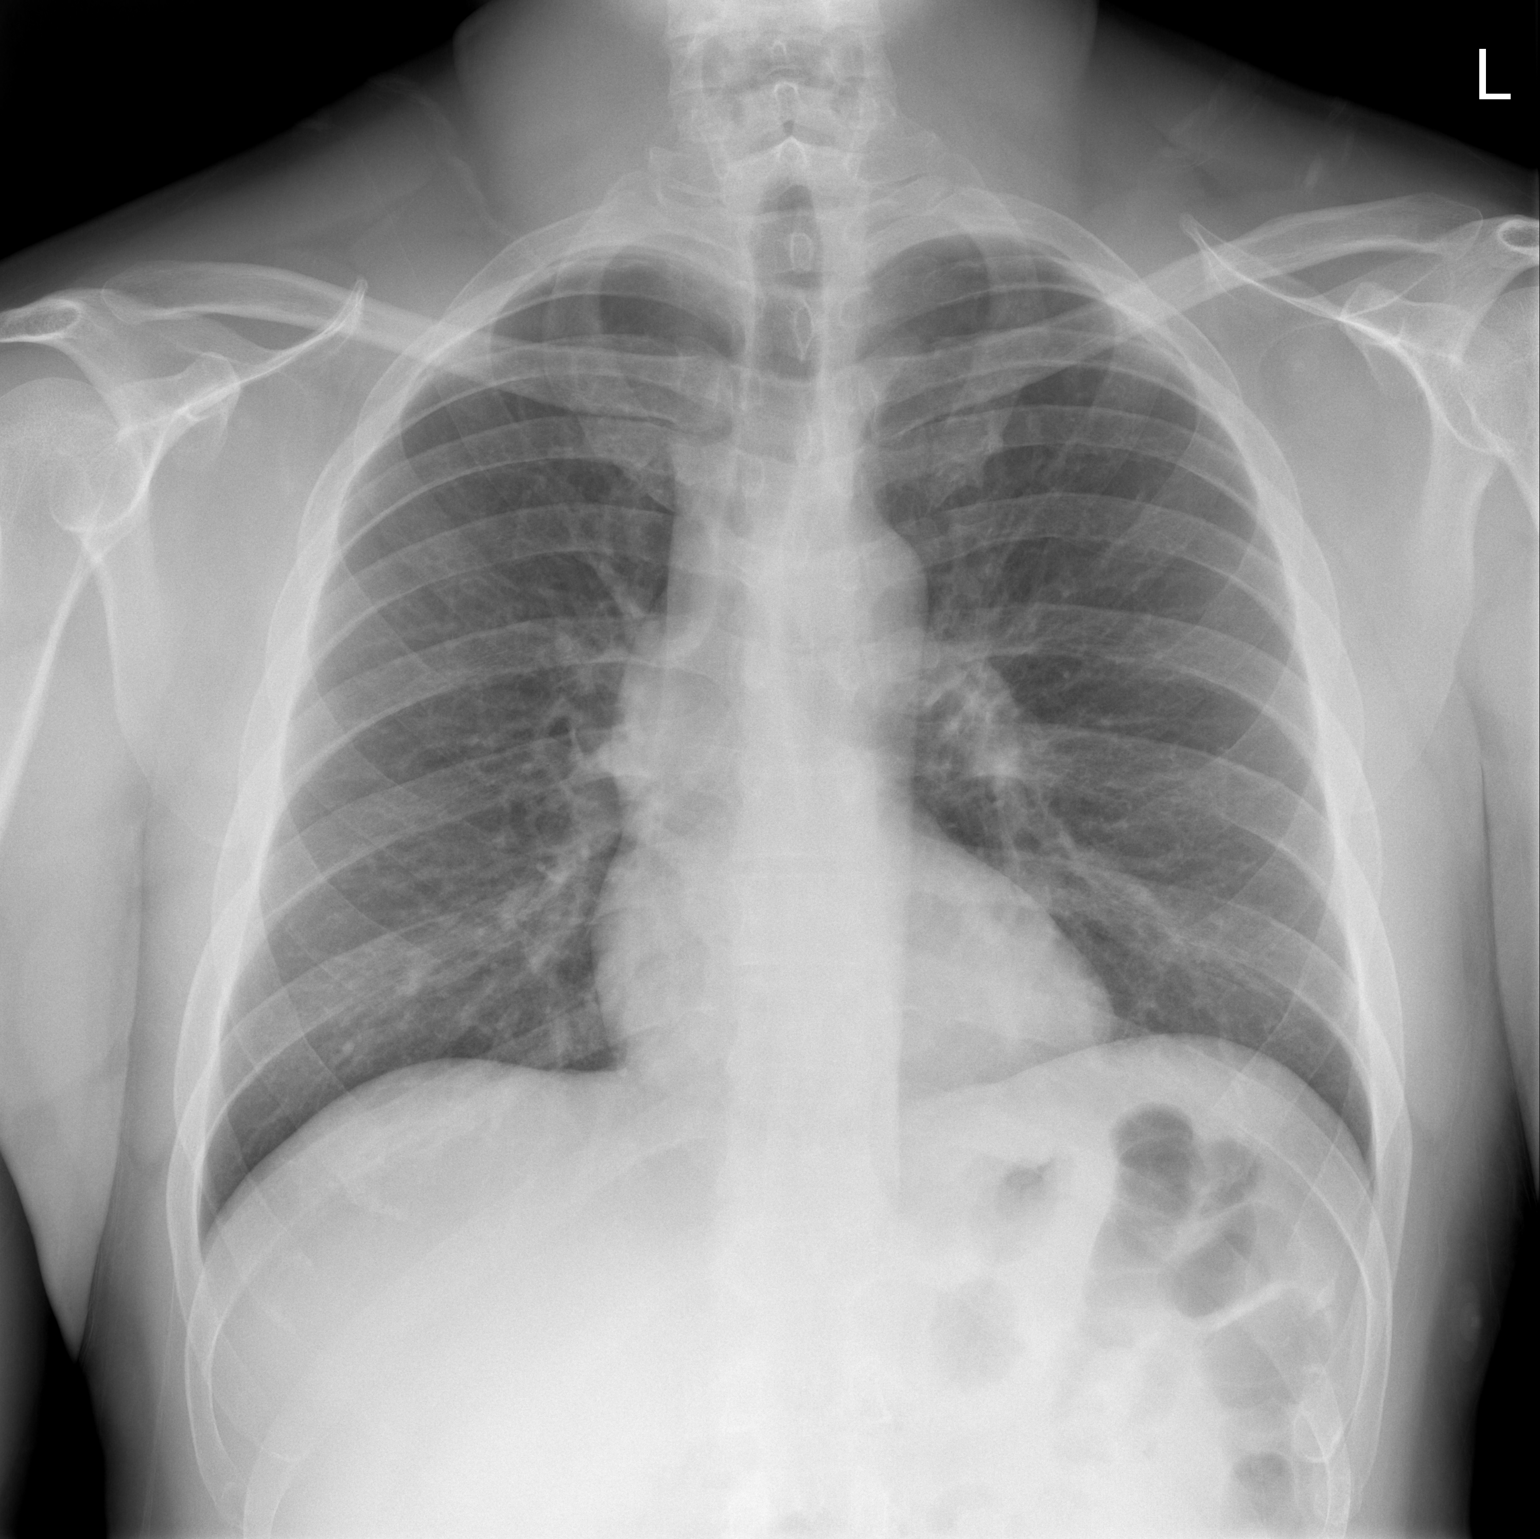

[w chest lat]
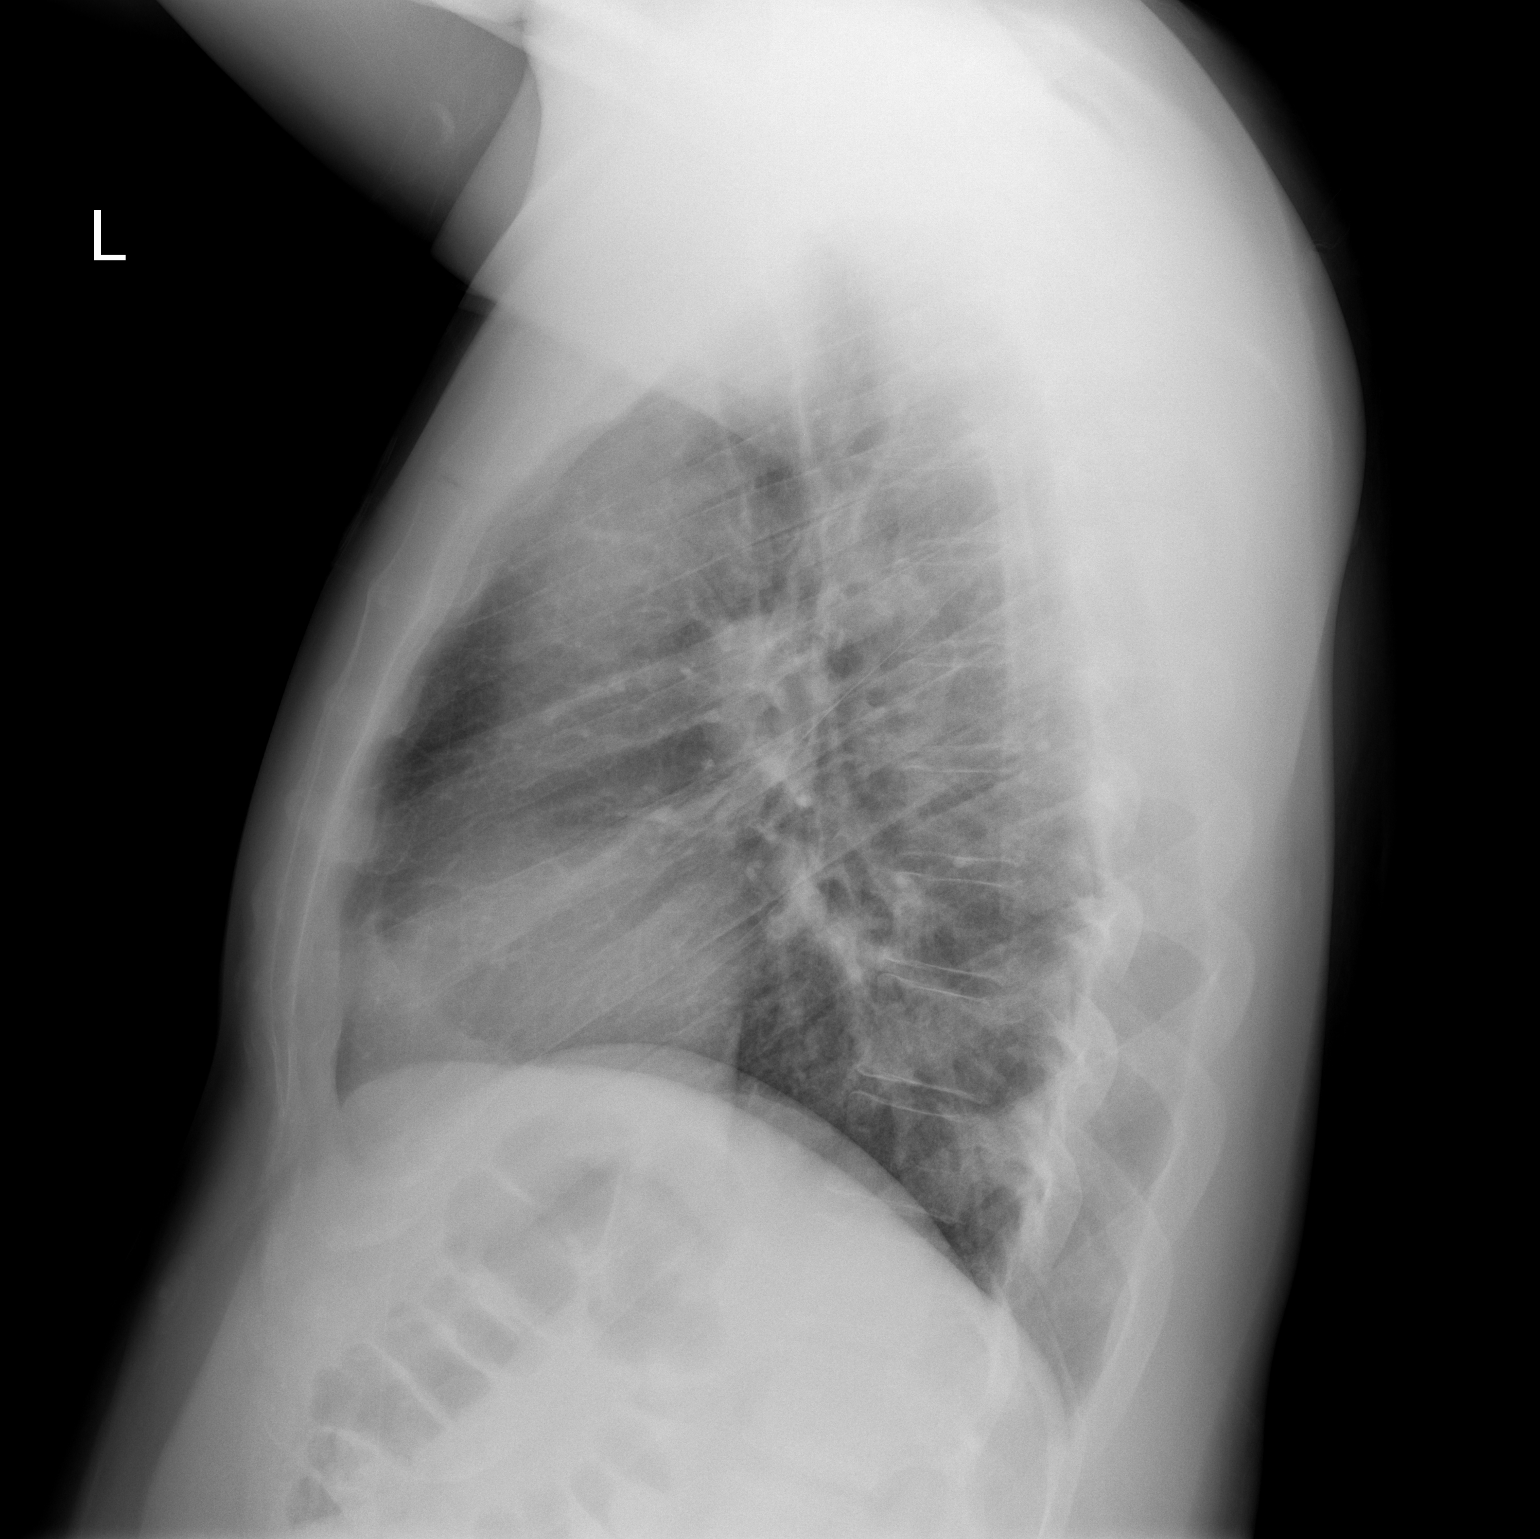

[2 of 2 positions shown; findings below may reference images not displayed]

FINDINGS: The cardiac and mediastinal silhouettes are within normal limits.

The lungs are normally inflated. Mild elevation of left
hemidiaphragm noted. No airspace consolidation, pleural effusion, or
pulmonary edema is identified. There is no pneumothorax.

No acute osseous abnormality identified.
IMPRESSION: No active cardiopulmonary disease.

## 2016-07-09 ENCOUNTER — Ambulatory Visit (INDEPENDENT_AMBULATORY_CARE_PROVIDER_SITE_OTHER): Payer: Self-pay | Admitting: Physician Assistant

## 2016-07-09 ENCOUNTER — Encounter: Payer: Self-pay | Admitting: Physician Assistant

## 2016-07-09 VITALS — BP 174/107 | HR 71 | Temp 98.7°F | Resp 18 | Ht 72.0 in | Wt 216.6 lb

## 2016-07-09 DIAGNOSIS — Z889 Allergy status to unspecified drugs, medicaments and biological substances status: Secondary | ICD-10-CM

## 2016-07-09 DIAGNOSIS — R002 Palpitations: Secondary | ICD-10-CM

## 2016-07-09 DIAGNOSIS — F4323 Adjustment disorder with mixed anxiety and depressed mood: Secondary | ICD-10-CM

## 2016-07-09 DIAGNOSIS — I10 Essential (primary) hypertension: Secondary | ICD-10-CM

## 2016-07-09 MED ORDER — FLUTICASONE PROPIONATE 50 MCG/ACT NA SUSP: 2.0000 | Freq: Every day | NASAL | 1 refills | Status: DC

## 2016-07-09 MED ORDER — HYDROCHLOROTHIAZIDE 25 MG PO TABS: 25.0000 mg | ORAL_TABLET | Freq: Every day | ORAL | 1 refills | Status: DC

## 2016-07-09 MED ORDER — CETIRIZINE HCL 10 MG PO TABS: 10.0000 mg | ORAL_TABLET | Freq: Every day | ORAL | 2 refills | Status: DC

## 2016-07-09 MED ORDER — FLUOXETINE HCL 40 MG PO CAPS: 40.0000 mg | ORAL_CAPSULE | Freq: Every day | ORAL | 0 refills | Status: DC

## 2016-07-09 MED ORDER — METOPROLOL TARTRATE 50 MG PO TABS: 50.0000 mg | ORAL_TABLET | Freq: Two times a day (BID) | ORAL | 1 refills | Status: DC

## 2016-07-09 NOTE — Progress Notes (Signed)
PRIMARY CARE AT [facility] [address], [place] [address] [phone]  Date:  07/09/2016   Name:  Cameron Wells   DOB:  01/09/1971   MRN:  [id number]  PCP:  No PCP Per Patient    History of Present Illness:  Cameron Folk Ainley is a 46 y.o. male patient who presents to PCP with  Chief Complaint  Patient presents with  . Establish Care    patient is here to establish care  . other    patients bp is elevated; however he has not taken is medication on today     --Patient is here to reestablish care. He was seen here 2 years ago in 2016 by Dr. Patsy Lager. 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  He is not currently working with them, as he did not want to work with the [place] area. --Blood pressure was monitored at the facility in [facility]. He continues to take the metoprolol and hydrochlorothiazide though he missed a dose this morning. He reports no chest pain, shortness of breath, leg swelling. He does have an occasional chest palpitation. This has happened episodically for years. No associated diaphoresis or nausea.  He may miss taking his medication.  No exercise at this time.  He does not ever work out.   --Continues Prozac at 40 mg. He reports that this working well for him. Panic attacks are very rare especially now that he has stopped employment and past shop. 2 years ago he was using Xanax 3 times per week. Patient reports that he has used stated maybe twice per month. He still has the same Xanax that Dr. Patsy Lager had given in him years ago. 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   --she is currently taking the hctz once per day, and the metoprolol once per day.   --Caffeine intake is 16 ounces. No family history of heart attacks.   --former smoker for 28 years, 1ppd.  Currently vaping --no hospitalizations in the interim.  No surgeries.  Wt Readings from Last 3 Encounters:  07/09/16 216 lb 9.6 oz (98.2 kg)   10/30/14 205 lb 9.6 oz (93.3 kg)  08/10/14 215 lb (97.5 kg)   Patient Active Problem List   Diagnosis Date Noted  . Adjustment disorder with mixed anxiety and depressed mood 08/10/2014  . Panic attack 08/10/2014  . Essential hypertension 06/19/2014    Past Medical History:  Diagnosis Date  . Allergy   . Anxiety   . Asthma   . Hypertension     No past surgical history on file.  Social History  Substance Use Topics  . Smoking status: Former Games developer  . Smokeless tobacco: Never Used  . Alcohol use 0.0 oz/week    Family History  Problem Relation Age of Onset  . CAD Maternal Grandmother   . Cancer Paternal Grandfather   . Hypertension Maternal Aunt     Allergies  Allergen Reactions  . Penicillins Anaphylaxis    Medication list has been reviewed and updated.  Current Outpatient Prescriptions on File Prior to Visit  Medication Sig Dispense Refill  . albuterol (PROVENTIL HFA;VENTOLIN HFA) 108 (90 BASE) MCG/ACT inhaler Inhale 2 puffs into the lungs every 6 (six) hours as needed for wheezing or shortness of breath. 1 Inhaler 2  . ALPRAZolam (XANAX) 0.5 MG tablet Take 0.5 tablets (0.25 mg total) by mouth 2 (two) times daily as needed for anxiety. 30 tablet 1  . fexofenadine-pseudoephedrine (ALLEGRA-D 24)  180-240 MG per 24 hr tablet Take 1 tablet by mouth daily.    Marland Kitchen. omeprazole (PRILOSEC OTC) 20 MG tablet Take 20 mg by mouth daily.     No current facility-administered medications on file prior to visit.     ROS ROS otherwise unremarkable unless listed above.  Physical Examination: BP (!) 174/107   Pulse 71   Temp 98.7 F (37.1 C) (Oral)   Resp 18   Ht 6' (1.829 m)   Wt 216 lb 9.6 oz (98.2 kg)   SpO2 95%   BMI 29.38 kg/m  Ideal Body Weight: Weight in (lb) to have BMI = 25: 183.9  Physical Exam  Constitutional: He is oriented to person, place, and time. He appears well-developed and well-nourished. No distress.  HENT:  Head: Normocephalic and atraumatic.   Eyes: Conjunctivae and EOM are normal. Pupils are equal, round, and reactive to light.  Cardiovascular: Normal rate, regular rhythm and normal heart sounds.   Pulses:      Radial pulses are 2+ on the right side, and 2+ on the left side.       Dorsalis pedis pulses are 2+ on the right side, and 2+ on the left side.  Pulmonary/Chest: Effort normal. No accessory muscle usage. No apnea. No respiratory distress. He has no decreased breath sounds. He has no wheezes. He has no rhonchi.  Neurological: He is alert and oriented to person, place, and time.  Skin: Skin is warm and dry. He is not diaphoretic.  Psychiatric: He has a normal mood and affect. His behavior is normal.     Assessment and Plan: Cameron FolkOmah Gaza is a 46 y.o. male who is here today for chief complaint of elevated blood pressure. --EKG has no acute findings. This was reviewed by a colleague and M.D. He does not engage in any exertional activity at this time.  I'm placing referral for cardiology consult. I'm refilling his medication for at least a month.  Advised to not miss his anti-hypertensive doses. He will return here for a physical exam and will have labs obtained. She declines this as he restarts his insurance which should be in 4 weeks.   Essential hypertension - Plan: hydrochlorothiazide (HYDRODIURIL) 25 MG tablet, metoprolol (LOPRESSOR) 50 MG tablet  Adjustment disorder with mixed anxiety and depressed mood - Plan: FLUoxetine (PROZAC) 40 MG capsule  Multiple allergies - Plan: cetirizine (ZYRTEC) 10 MG tablet, fluticasone (FLONASE) 50 MCG/ACT nasal spray  Palpitations - Plan: EKG 12-Lead  Trena PlattStephanie Dallie Patton, PA-C [facility] 3/21/20182:26 PM

## 2016-07-09 NOTE — Patient Instructions (Addendum)
  Start the omega acid supplement today  IF you received an x-ray today, you will receive an invoice from [facility]. Please contact [facility] at [phone] with questions or concerns regarding your invoice.   IF you received labwork today, you will receive an invoice from [facility]. Please contact [facility] at [phone] with questions or concerns regarding your invoice.   Our billing staff will not be able to assist you with questions regarding bills from these companies.  You will be contacted with the lab results as soon as they are available. The fastest way to get your results is to activate your My Chart account. Instructions are located on the last page of this paperwork. If you have not heard from us regarding the results in 2 weeks, please contact this office.

## 2016-10-16 ENCOUNTER — Other Ambulatory Visit: Payer: Self-pay | Admitting: Physician Assistant

## 2016-10-16 DIAGNOSIS — F4323 Adjustment disorder with mixed anxiety and depressed mood: Secondary | ICD-10-CM

## 2016-10-16 NOTE — Telephone Encounter (Signed)
For Prozac refill.  No mention of follow up  Please advise.

## 2016-10-16 NOTE — Telephone Encounter (Signed)
Last OV with PA-English was 06/2016. Patient was doing very well, will refill for 3 months. Patient is due for f/u at that point unless PA-English would like a different follow up plan.

## 2016-10-18 ENCOUNTER — Other Ambulatory Visit: Payer: Self-pay | Admitting: Physician Assistant

## 2016-10-18 DIAGNOSIS — F4323 Adjustment disorder with mixed anxiety and depressed mood: Secondary | ICD-10-CM

## 2016-10-18 NOTE — Telephone Encounter (Signed)
Pt called in asking about his Fluoxetine refill from [place], says that he needs to be taking this daily & that the pharmacy has sent over faxes to us.   Please Advise

## 2016-10-20 ENCOUNTER — Telehealth: Payer: Self-pay | Admitting: Physician Assistant

## 2016-10-20 NOTE — Telephone Encounter (Signed)
Pt is checking on status of  A refill request that was put in on 10/18/16 he states he is completely out   Best number [phone]

## 2016-10-21 NOTE — Telephone Encounter (Signed)
Ordered by Xcel Energymike mani

## 2016-10-27 NOTE — Telephone Encounter (Signed)
Attempted to call and figure out what rx was requested. No refill requests for this patient in rx requests. Unable to take calls at this time.

## 2016-10-28 NOTE — Telephone Encounter (Signed)
Unable to reach, letter sent

## 2016-11-01 NOTE — Telephone Encounter (Signed)
Per last OV note, pt was supposed to return for a physical exam and labs. Pt is still without insurance and unable to schedule PE. States he should have insurance in the next 4 weeks as he just started a new job

## 2016-11-01 NOTE — Telephone Encounter (Signed)
Pt returned call, updated phone number. Pt is requesting a refill on his HCTZ.

## 2017-01-29 ENCOUNTER — Encounter: Payer: Self-pay | Admitting: Physician Assistant

## 2017-01-29 ENCOUNTER — Ambulatory Visit (INDEPENDENT_AMBULATORY_CARE_PROVIDER_SITE_OTHER): Payer: Self-pay | Admitting: Physician Assistant

## 2017-01-29 VITALS — BP 168/100 | HR 69 | Temp 99.1°F | Resp 16 | Ht 72.0 in | Wt 218.0 lb

## 2017-01-29 DIAGNOSIS — F4323 Adjustment disorder with mixed anxiety and depressed mood: Secondary | ICD-10-CM

## 2017-01-29 DIAGNOSIS — Z889 Allergy status to unspecified drugs, medicaments and biological substances status: Secondary | ICD-10-CM

## 2017-01-29 DIAGNOSIS — I10 Essential (primary) hypertension: Secondary | ICD-10-CM

## 2017-01-29 DIAGNOSIS — F410 Panic disorder [episodic paroxysmal anxiety] without agoraphobia: Secondary | ICD-10-CM

## 2017-01-29 LAB — POCT URINALYSIS DIP (MANUAL ENTRY)
BILIRUBIN UA: NEGATIVE
GLUCOSE UA: NEGATIVE mg/dL
Leukocytes, UA: NEGATIVE
NITRITE UA: NEGATIVE
Protein Ur, POC: 30 mg/dL — AB
RBC UA: NEGATIVE
SPEC GRAV UA: 1.025 (ref 1.010–1.025)
Urobilinogen, UA: 2 E.U./dL — AB
pH, UA: 6.5 (ref 5.0–8.0)

## 2017-01-29 MED ORDER — FLUOXETINE HCL 40 MG PO CAPS: 40.0000 mg | ORAL_CAPSULE | Freq: Every day | ORAL | 3 refills | Status: DC

## 2017-01-29 MED ORDER — METOPROLOL TARTRATE 50 MG PO TABS: 50.0000 mg | ORAL_TABLET | Freq: Two times a day (BID) | ORAL | 1 refills | Status: DC

## 2017-01-29 MED ORDER — CETIRIZINE HCL 10 MG PO TABS: 10.0000 mg | ORAL_TABLET | Freq: Every day | ORAL | 11 refills | Status: DC

## 2017-01-29 MED ORDER — ALPRAZOLAM 0.5 MG PO TABS: 0.5000 mg | ORAL_TABLET | Freq: Two times a day (BID) | ORAL | 1 refills | Status: DC | PRN

## 2017-01-29 MED ORDER — LOSARTAN POTASSIUM 50 MG PO TABS: 50.0000 mg | ORAL_TABLET | Freq: Every day | ORAL | 2 refills | Status: DC

## 2017-01-29 NOTE — Patient Instructions (Addendum)
 DASH Eating Plan DASH stands for "Dietary Approaches to Stop Hypertension." The DASH eating plan is a healthy eating plan that has been shown to reduce high blood pressure (hypertension). It may also reduce your risk for type 2 diabetes, heart disease, and stroke. The DASH eating plan may also help with weight loss. What are tips for following this plan? General guidelines  Avoid eating more than 2,300 mg (milligrams) of salt (sodium) a day. If you have hypertension, you may need to reduce your sodium intake to 1,500 mg a day.  Limit alcohol intake to no more than 1 drink a day for nonpregnant women and 2 drinks a day for men. One drink equals 12 oz of beer, 5 oz of wine, or 1 oz of hard liquor.  Work with your health care provider to maintain a healthy body weight or to lose weight. Ask what an ideal weight is for you.  Get at least 30 minutes of exercise that causes your heart to beat faster (aerobic exercise) most days of the week. Activities may include walking, swimming, or biking.  Work with your health care provider or diet and nutrition specialist (dietitian) to adjust your eating plan to your individual calorie needs. Reading food labels  Check food labels for the amount of sodium per serving. Choose foods with less than 5 percent of the Daily Value of sodium. Generally, foods with less than 300 mg of sodium per serving fit into this eating plan.  To find whole grains, look for the word "whole" as the first word in the ingredient list. Shopping  Buy products labeled as "low-sodium" or "no salt added."  Buy fresh foods. Avoid canned foods and premade or frozen meals. Cooking  Avoid adding salt when cooking. Use salt-free seasonings or herbs instead of table salt or sea salt. Check with your health care provider or pharmacist before using salt substitutes.  Do not fry foods. Cook foods using healthy methods such as baking, boiling, grilling, and broiling instead.  Cook with  heart-healthy oils, such as olive, canola, soybean, or sunflower oil. Meal planning   Eat a balanced diet that includes: ? 5 or more servings of fruits and vegetables each day. At each meal, try to fill half of your plate with fruits and vegetables. ? Up to 6-8 servings of whole grains each day. ? Less than 6 oz of lean meat, poultry, or fish each day. A 3-oz serving of meat is about the same size as a deck of cards. One egg equals 1 oz. ? 2 servings of low-fat dairy each day. ? A serving of nuts, seeds, or beans 5 times each week. ? Heart-healthy fats. Healthy fats called Omega-3 fatty acids are found in foods such as flaxseeds and coldwater fish, like sardines, salmon, and mackerel.  Limit how much you eat of the following: ? Canned or prepackaged foods. ? Food that is high in trans fat, such as fried foods. ? Food that is high in saturated fat, such as fatty meat. ? Sweets, desserts, sugary drinks, and other foods with added sugar. ? Full-fat dairy products.  Do not salt foods before eating.  Try to eat at least 2 vegetarian meals each week.  Eat more home-cooked food and less restaurant, buffet, and fast food.  When eating at a restaurant, ask that your food be prepared with less salt or no salt, if possible. What foods are recommended? The items listed may not be a complete list. Talk with your dietitian about   what dietary choices are best for you. Grains Whole-grain or whole-wheat bread. Whole-grain or whole-wheat pasta. Brown rice. Oatmeal. Quinoa. Bulgur. Whole-grain and low-sodium cereals. Pita bread. Low-fat, low-sodium crackers. Whole-wheat flour tortillas. Vegetables Fresh or frozen vegetables (raw, steamed, roasted, or grilled). Low-sodium or reduced-sodium tomato and vegetable juice. Low-sodium or reduced-sodium tomato sauce and tomato paste. Low-sodium or reduced-sodium canned vegetables. Fruits All fresh, dried, or frozen fruit. Canned fruit in natural juice (without  added sugar). Meat and other protein foods Skinless chicken or turkey. Ground chicken or turkey. Pork with fat trimmed off. Fish and seafood. Egg whites. Dried beans, peas, or lentils. Unsalted nuts, nut butters, and seeds. Unsalted canned beans. Lean cuts of beef with fat trimmed off. Low-sodium, lean deli meat. Dairy Low-fat (1%) or fat-free (skim) milk. Fat-free, low-fat, or reduced-fat cheeses. Nonfat, low-sodium ricotta or cottage cheese. Low-fat or nonfat yogurt. Low-fat, low-sodium cheese. Fats and oils Soft margarine without trans fats. Vegetable oil. Low-fat, reduced-fat, or light mayonnaise and salad dressings (reduced-sodium). Canola, safflower, olive, soybean, and sunflower oils. Avocado. Seasoning and other foods Herbs. Spices. Seasoning mixes without salt. Unsalted popcorn and pretzels. Fat-free sweets. What foods are not recommended? The items listed may not be a complete list. Talk with your dietitian about what dietary choices are best for you. Grains Baked goods made with fat, such as croissants, muffins, or some breads. Dry pasta or rice meal packs. Vegetables Creamed or fried vegetables. Vegetables in a cheese sauce. Regular canned vegetables (not low-sodium or reduced-sodium). Regular canned tomato sauce and paste (not low-sodium or reduced-sodium). Regular tomato and vegetable juice (not low-sodium or reduced-sodium). Pickles. Olives. Fruits Canned fruit in a light or heavy syrup. Fried fruit. Fruit in cream or butter sauce. Meat and other protein foods Fatty cuts of meat. Ribs. Fried meat. Bacon. Sausage. Bologna and other processed lunch meats. Salami. Fatback. Hotdogs. Bratwurst. Salted nuts and seeds. Canned beans with added salt. Canned or smoked fish. Whole eggs or egg yolks. Chicken or turkey with skin. Dairy Whole or 2% milk, cream, and half-and-half. Whole or full-fat cream cheese. Whole-fat or sweetened yogurt. Full-fat cheese. Nondairy creamers. Whipped toppings.  Processed cheese and cheese spreads. Fats and oils Butter. Stick margarine. Lard. Shortening. Ghee. Bacon fat. Tropical oils, such as coconut, palm kernel, or palm oil. Seasoning and other foods Salted popcorn and pretzels. Onion salt, garlic salt, seasoned salt, table salt, and sea salt. Worcestershire sauce. Tartar sauce. Barbecue sauce. Teriyaki sauce. Soy sauce, including reduced-sodium. Steak sauce. Canned and packaged gravies. Fish sauce. Oyster sauce. Cocktail sauce. Horseradish that you find on the shelf. Ketchup. Mustard. Meat flavorings and tenderizers. Bouillon cubes. Hot sauce and Tabasco sauce. Premade or packaged marinades. Premade or packaged taco seasonings. Relishes. Regular salad dressings. Where to find more information:  National Heart, Lung, and Blood Institute: www.nhlbi.nih.gov  American Heart Association: www.heart.org Summary  The DASH eating plan is a healthy eating plan that has been shown to reduce high blood pressure (hypertension). It may also reduce your risk for type 2 diabetes, heart disease, and stroke.  With the DASH eating plan, you should limit salt (sodium) intake to 2,300 mg a day. If you have hypertension, you may need to reduce your sodium intake to 1,500 mg a day.  When on the DASH eating plan, aim to eat more fresh fruits and vegetables, whole grains, lean proteins, low-fat dairy, and heart-healthy fats.  Work with your health care provider or diet and nutrition specialist (dietitian) to adjust your eating plan to your   individual calorie needs. This information is not intended to replace advice given to you by your health care provider. Make sure you discuss any questions you have with your health care provider. Document Released: 03/27/2011 Document Revised: 03/31/2016 Document Reviewed: 03/31/2016 Elsevier Interactive Patient Education  2017 Elsevier Inc.    IF you received an x-ray today, you will receive an invoice from [facility].  Please contact [facility] at [phone] with questions or concerns regarding your invoice.   IF you received labwork today, you will receive an invoice from [facility]. Please contact [facility] at [phone] with questions or concerns regarding your invoice.   Our billing staff will not be able to assist you with questions regarding bills from these companies.  You will be contacted with the lab results as soon as they are available. The fastest way to get your results is to activate your My Chart account. Instructions are located on the last page of this paperwork. If you have not heard from us regarding the results in 2 weeks, please contact this office.     

## 2017-01-29 NOTE — Progress Notes (Signed)
PRIMARY CARE AT [facility] [address], [place] [address] [phone]  Date:  01/29/2017   Name:  Cameron Wells   DOB:  02-04-1971   MRN:  [id number]  PCP:  Patient, No Pcp Per    History of Present Illness:  Cameron Wells is a 46 y.o. male patient who presents to PCP with  Chief Complaint  Patient presents with  . Medication Refill    all, pt would like to get meds at the same time, 90 days. pt did not take BP med this morning     He did not take the blood pressure at home. He is walking more.   Eats a lot of [place] cuisine May have 2 drinks of beer after work.   He reports panic attacks and inability to sleep.  He rarely takes the xanax just to help him sleep.    Wt Readings from Last 3 Encounters:  01/29/17 218 lb (98.9 kg)  07/09/16 216 lb 9.6 oz (98.2 kg)  10/30/14 205 lb 9.6 oz (93.3 kg)    Patient Active Problem List   Diagnosis Date Noted  . Adjustment disorder with mixed anxiety and depressed mood 08/10/2014  . Panic attack 08/10/2014  . Essential hypertension 06/19/2014    Past Medical History:  Diagnosis Date  . Allergy   . Anxiety   . Asthma   . Hypertension     History reviewed. No pertinent surgical history.  Social History  Substance Use Topics  . Smoking status: Former Games developer  . Smokeless tobacco: Never Used  . Alcohol use 0.0 oz/week    Family History  Problem Relation Age of Onset  . CAD Maternal Grandmother   . Cancer Paternal Grandfather   . Hypertension Maternal Aunt     Allergies  Allergen Reactions  . Penicillins Anaphylaxis    Medication list has been reviewed and updated.  Current Outpatient Prescriptions on File Prior to Visit  Medication Sig Dispense Refill  . albuterol (PROVENTIL HFA;VENTOLIN HFA) 108 (90 BASE) MCG/ACT inhaler Inhale 2 puffs into the lungs every 6 (six) hours as needed for wheezing or shortness of breath. 1 Inhaler 2  . ALPRAZolam (XANAX) 0.5 MG tablet Take 0.5 tablets (0.25 mg total) by mouth 2 (two)  times daily as needed for anxiety. 30 tablet 1  . cetirizine (ZYRTEC) 10 MG tablet Take 1 tablet (10 mg total) by mouth daily. 30 tablet 2  . fexofenadine-pseudoephedrine (ALLEGRA-D 24) 180-240 MG per 24 hr tablet Take 1 tablet by mouth daily.    Marland Kitchen FLUoxetine (PROZAC) 40 MG capsule Take 1 capsule (40 mg total) by mouth daily. Due for office visit in October 2018. 90 capsule 0  . fluticasone (FLONASE) 50 MCG/ACT nasal spray Place 2 sprays into both nostrils daily. 16 g 1  . hydrochlorothiazide (HYDRODIURIL) 25 MG tablet Take 1 tablet (25 mg total) by mouth daily. 30 tablet 1  . metoprolol (LOPRESSOR) 50 MG tablet Take 1 tablet (50 mg total) by mouth 2 (two) times daily. 60 tablet 1  . omeprazole (PRILOSEC OTC) 20 MG tablet Take 20 mg by mouth daily.     No current facility-administered medications on file prior to visit.     ROS ROS otherwise unremarkable unless listed above.  Physical Examination: BP (!) 168/100   Pulse 69   Temp 99.1 F (37.3 C) (Oral)   Resp 16   Ht 6' (1.829 m)   Wt 218 lb (98.9 kg)   SpO2 98%   BMI 29.57 kg/m  Ideal Body Weight: Weight in (lb) to have BMI = 25: 183.9  Physical Exam  Constitutional: He is oriented to person, place, and time. He appears well-developed and well-nourished. No distress.  HENT:  Head: Normocephalic and atraumatic.  Eyes: Pupils are equal, round, and reactive to light. Conjunctivae and EOM are normal.  Cardiovascular: Normal rate, regular rhythm and normal heart sounds.  Exam reveals no friction rub.   No murmur heard. Pulmonary/Chest: Effort normal. No respiratory distress.  Neurological: He is alert and oriented to person, place, and time.  Skin: Skin is warm and dry. He is not diaphoretic.  Psychiatric: He has a normal mood and affect. His behavior is normal.    Results for orders placed or performed in visit on 01/29/17  POCT urinalysis dipstick  Result Value Ref Range   Color, UA yellow yellow   Clarity, UA clear clear    Glucose, UA negative negative mg/dL   Bilirubin, UA negative negative   Ketones, POC UA trace (5) (A) negative mg/dL   Spec Grav, UA 1.610 9.604 - 1.025   Blood, UA negative negative   pH, UA 6.5 5.0 - 8.0   Protein Ur, POC =30 (A) negative mg/dL   Urobilinogen, UA 2.0 (A) 0.2 or 1.0 E.U./dL   Nitrite, UA Negative Negative   Leukocytes, UA Negative Negative    Assessment and Plan: Cameron Wells is a 46 y.o. male who is here today for cc of  Chief Complaint  Patient presents with  . Medication Refill    all, pt would like to get meds at the same time, 90 days. pt did not take BP med this morning  dash diet given Advised to decrease to only 1 alcoholic beverage Refilling the xanax.  He rarely fills this.  He will need to return for any additional refills.  Refilling the prozac for 1 year. Switching the hctz to losartan.  Advised to follow up in 2 weeks.  Continue the metoprolol Essential hypertension - Plan: losartan (COZAAR) 50 MG tablet, POCT urinalysis dipstick  Panic attack - Plan: ALPRAZolam (XANAX) 0.5 MG tablet  Adjustment disorder with mixed anxiety and depressed mood - Plan: FLUoxetine (PROZAC) 40 MG capsule  Multiple allergies - Plan: cetirizine (ZYRTEC) 10 MG tablet  Trena Platt, PA-C [facility] 10/11/20185:58 PM

## 2017-05-23 ENCOUNTER — Other Ambulatory Visit: Payer: Self-pay | Admitting: Physician Assistant

## 2017-05-23 DIAGNOSIS — I10 Essential (primary) hypertension: Secondary | ICD-10-CM

## 2017-07-05 ENCOUNTER — Other Ambulatory Visit: Payer: Self-pay | Admitting: Physician Assistant

## 2017-07-05 DIAGNOSIS — I10 Essential (primary) hypertension: Secondary | ICD-10-CM

## 2017-07-20 ENCOUNTER — Encounter: Payer: Self-pay | Admitting: Physician Assistant

## 2017-12-29 ENCOUNTER — Other Ambulatory Visit: Payer: Self-pay | Admitting: Family Medicine

## 2017-12-29 DIAGNOSIS — F4323 Adjustment disorder with mixed anxiety and depressed mood: Secondary | ICD-10-CM

## 2017-12-29 DIAGNOSIS — R0602 Shortness of breath: Secondary | ICD-10-CM

## 2017-12-29 DIAGNOSIS — I10 Essential (primary) hypertension: Secondary | ICD-10-CM

## 2017-12-29 DIAGNOSIS — Z889 Allergy status to unspecified drugs, medicaments and biological substances status: Secondary | ICD-10-CM

## 2017-12-29 MED ORDER — FLUOXETINE HCL 40 MG PO CAPS: 40.0000 mg | ORAL_CAPSULE | Freq: Every day | ORAL | 0 refills | Status: DC

## 2017-12-29 MED ORDER — LOSARTAN POTASSIUM 50 MG PO TABS: 50.0000 mg | ORAL_TABLET | Freq: Every day | ORAL | 0 refills | Status: DC

## 2017-12-29 MED ORDER — FLUTICASONE PROPIONATE 50 MCG/ACT NA SUSP: 2.0000 | Freq: Every day | NASAL | 6 refills | Status: DC

## 2017-12-29 MED ORDER — METOPROLOL TARTRATE 50 MG PO TABS: 50.0000 mg | ORAL_TABLET | Freq: Two times a day (BID) | ORAL | 0 refills | Status: DC

## 2017-12-29 NOTE — Progress Notes (Signed)
Patient to follow up  Next APPT 12/11 with me

## 2018-01-20 ENCOUNTER — Telehealth: Payer: Self-pay | Admitting: Family Medicine

## 2018-01-20 NOTE — Telephone Encounter (Signed)
Called and spoke with pt regarding his appt with Dr. Creta Levin on 12/11. Provider schedule has changed and I was able to get pt rescheduled for 12/3 at 11:40 am. Advised pt of time, building location and late policy.

## 2018-03-23 ENCOUNTER — Ambulatory Visit: Payer: Self-pay | Admitting: Family Medicine

## 2018-03-31 ENCOUNTER — Ambulatory Visit: Payer: Self-pay | Admitting: Family Medicine

## 2018-04-13 ENCOUNTER — Ambulatory Visit (INDEPENDENT_AMBULATORY_CARE_PROVIDER_SITE_OTHER): Payer: Self-pay | Admitting: Family Medicine

## 2018-04-13 ENCOUNTER — Encounter: Payer: Self-pay | Admitting: Family Medicine

## 2018-04-13 ENCOUNTER — Other Ambulatory Visit: Payer: Self-pay

## 2018-04-13 VITALS — BP 174/124 | HR 76 | Temp 99.0°F | Ht 72.0 in | Wt 225.4 lb

## 2018-04-13 DIAGNOSIS — Z23 Encounter for immunization: Secondary | ICD-10-CM

## 2018-04-13 DIAGNOSIS — I10 Essential (primary) hypertension: Secondary | ICD-10-CM

## 2018-04-13 DIAGNOSIS — E785 Hyperlipidemia, unspecified: Secondary | ICD-10-CM

## 2018-04-13 MED ORDER — LOSARTAN POTASSIUM-HCTZ 100-12.5 MG PO TABS: 1.0000 | ORAL_TABLET | Freq: Every day | ORAL | 3 refills | Status: DC

## 2018-04-13 MED ORDER — ZOSTER VAC RECOMB ADJUVANTED 50 MCG/0.5ML IM SUSR: 0.5000 mL | Freq: Once | INTRAMUSCULAR | 0 refills | Status: AC

## 2018-04-13 MED ORDER — METOPROLOL TARTRATE 50 MG PO TABS: 50.0000 mg | ORAL_TABLET | Freq: Two times a day (BID) | ORAL | 0 refills | Status: DC

## 2018-04-13 NOTE — Progress Notes (Signed)
Established Patient Office Visit  Subjective:  Patient ID: Cameron Wells, male    DOB: [birth year]  Age: 47 y.o. MRN: [id number]  CC:  Chief Complaint  Patient presents with  . Transitions Of Care  . Medication Refill  . Hypertension    HPI Cameron Wells presents for   Hypertension: Patient here for follow-up of elevated blood pressure. He is not exercising and is adherent to low salt diet.  Blood pressure is not well controlled at home. Cardiac symptoms none. Patient denies chest pain, chest pressure/discomfort, claudication, dyspnea, exertional chest pressure/discomfort, fatigue, irregular heart beat and lower extremity edema.  Cardiovascular risk factors: dyslipidemia, hypertension, male gender and sedentary lifestyle. Use of agents associated with hypertension: none. History of target organ damage: none. BP Readings from Last 3 Encounters:  04/13/18 (!) 174/124  01/29/17 (!) 168/100  07/09/16 (!) 174/107    bp uncontrolled He is a nonsmoker, no nsaids, no dextromorphan or phenylephrine Family history of hypertension  Quit smoking in 2011 He was taking hctz but you were not taking the hctz He does not know why it was stopped   He reports that at the age of about 84 he developed chicken pox and that was his first outbreak. His girlfriend and his 2yo also had chicken pox at the same time. He reports that the health department quarantined them in the house. He now would like to get a shingles vaccine.  No fevers or chills No recent exposure to shingles No rash or burning pain  Past Medical History:  Diagnosis Date  . Allergy   . Anxiety   . Asthma   . Hypertension     No past surgical history on file.  Family History  Problem Relation Age of Onset  . CAD Maternal Grandmother   . Cancer Paternal Grandfather   . Hypertension Maternal Aunt     Social History   Socioeconomic History  . Marital status: Married    Spouse name: Not on file  . Number of children: Not on  file  . Years of education: Not on file  . Highest education level: Not on file  Occupational History  . Not on file  Social Needs  . Financial resource strain: Not on file  . Food insecurity:    Worry: Not on file    Inability: Not on file  . Transportation needs:    Medical: Not on file    Non-medical: Not on file  Tobacco Use  . Smoking status: Former Games developer  . Smokeless tobacco: Never Used  Substance and Sexual Activity  . Alcohol use: Yes    Alcohol/week: 0.0 standard drinks  . Drug use: No  . Sexual activity: Not on file  Lifestyle  . Physical activity:    Days per week: Not on file    Minutes per session: Not on file  . Stress: Not on file  Relationships  . Social connections:    Talks on phone: Not on file    Gets together: Not on file    Attends religious service: Not on file    Active member of club or organization: Not on file    Attends meetings of clubs or organizations: Not on file    Relationship status: Not on file  . Intimate partner violence:    Fear of current or ex partner: Not on file    Emotionally abused: Not on file    Physically abused: Not on file    Forced sexual activity: Not on  file  Other Topics Concern  . Not on file  Social History Narrative  . Not on file    Outpatient Medications Prior to Visit  Medication Sig Dispense Refill  . FLUoxetine (PROZAC) 40 MG capsule Take 1 capsule (40 mg total) by mouth daily. 90 capsule 0  . fluticasone (FLONASE) 50 MCG/ACT nasal spray Place 2 sprays into both nostrils daily. 16 g 6  . omeprazole (PRILOSEC OTC) 20 MG tablet Take 20 mg by mouth daily.    Marland Kitchen. losartan (COZAAR) 50 MG tablet Take 1 tablet (50 mg total) by mouth daily. Office visit needed 90 tablet 0  . metoprolol tartrate (LOPRESSOR) 50 MG tablet Take 1 tablet (50 mg total) by mouth 2 (two) times daily. 180 tablet 0  . fexofenadine-pseudoephedrine (ALLEGRA-D 24) 180-240 MG per 24 hr tablet Take 1 tablet by mouth daily.    Marland Kitchen. albuterol  (PROVENTIL HFA;VENTOLIN HFA) 108 (90 BASE) MCG/ACT inhaler Inhale 2 puffs into the lungs every 6 (six) hours as needed for wheezing or shortness of breath. (Patient not taking: Reported on 04/13/2018) 1 Inhaler 2  . ALPRAZolam (XANAX) 0.5 MG tablet Take 1 tablet (0.5 mg total) by mouth 2 (two) times daily as needed for anxiety. (Patient not taking: Reported on 04/13/2018) 60 tablet 1  . cetirizine (ZYRTEC) 10 MG tablet Take 1 tablet (10 mg total) by mouth daily. (Patient not taking: Reported on 04/13/2018) 30 tablet 11   No facility-administered medications prior to visit.     Allergies  Allergen Reactions  . Penicillins Anaphylaxis    ROS Review of Systems See hpi   Objective:    Physical Exam  BP (!) 174/124 (BP Location: Left Arm, Patient Position: Sitting, Cuff Size: Large)   Pulse 76   Temp 99 F (37.2 C) (Oral)   Ht 6' (1.829 m)   Wt 225 lb 6.4 oz (102.2 kg)   SpO2 95%   BMI 30.57 kg/m  Wt Readings from Last 3 Encounters:  04/13/18 225 lb 6.4 oz (102.2 kg)  01/29/17 218 lb (98.9 kg)  07/09/16 216 lb 9.6 oz (98.2 kg)   Physical Exam  Constitutional: Oriented to person, place, and time. Appears well-developed and well-nourished.  HENT:  Head: Normocephalic and atraumatic.  Eyes: Conjunctivae and EOM are normal.  Cardiovascular: Normal rate, regular rhythm, normal heart sounds and intact distal pulses.  No murmur heard. Pulmonary/Chest: Effort normal and breath sounds normal. No stridor. No respiratory distress. Has no wheezes.  Neurological: Is alert and oriented to person, place, and time.  Skin: Skin is warm. Capillary refill takes less than 2 seconds.  Psychiatric: Has a normal mood and affect. Behavior is normal. Judgment and thought content normal.    Health Maintenance Due  Topic Date Due  . HIV Screening  09/07/1984  . TETANUS/TDAP  09/07/1988  . INFLUENZA VACCINE  11/19/2017    There are no preventive care reminders to display for this patient.  Lab  Results  Component Value Date   TSH 1.150 04/13/2018   Lab Results  Component Value Date   WBC 8.9 06/07/2014   HGB 14.9 06/07/2014   HCT 43.0 06/07/2014   MCV 89.2 06/07/2014   PLT 187 06/07/2014   Lab Results  Component Value Date   NA 139 04/13/2018   K 5.0 04/13/2018   CO2 22 04/13/2018   GLUCOSE 114 (H) 04/13/2018   BUN 12 04/13/2018   CREATININE 1.27 04/13/2018   BILITOT 0.4 04/13/2018   ALKPHOS 66 04/13/2018  AST 27 04/13/2018   ALT 34 04/13/2018   PROT 7.4 04/13/2018   ALBUMIN 4.7 04/13/2018   CALCIUM 9.7 04/13/2018   ANIONGAP 8 06/07/2014   Lab Results  Component Value Date   CHOL 334 (H) 04/13/2018   Lab Results  Component Value Date   HDL 40 04/13/2018   Lab Results  Component Value Date   LDLCALC 232 (H) 04/13/2018   Lab Results  Component Value Date   TRIG 309 (H) 04/13/2018   Lab Results  Component Value Date   CHOLHDL 8.4 (H) 04/13/2018   No results found for: HGBA1C    Assessment & Plan:   Problem List Items Addressed This Visit      Cardiovascular and Mediastinum   Uncontrolled hypertension - Primary-  Adjusted medication to improve bp Advise pt to follow up in 3 months to reassess bp and assess home bp monitoring Will continue metoprolol  Added back hctz as there was no contraindication anywhere in the notes and pt denies adverse reaction in the past   Relevant Medications   losartan-hydrochlorothiazide (HYZAAR) 100-12.5 MG tablet   metoprolol tartrate (LOPRESSOR) 50 MG tablet     Other   Dyslipidemia -  Will plan to treat with lifestyle changes then add medication    Other Visit Diagnoses    Need for shingles vaccine    -  Discussed shingles distribution and advised pt to get vaccine from the pharmacy.       Meds ordered this encounter  Medications  . Zoster Vaccine Adjuvanted [facility](SHINGRIX) injection    Sig: Inject 0.5 mLs into the muscle once for 1 dose.    Dispense:  0.5 mL    Refill:  0  . losartan-hydrochlorothiazide  (HYZAAR) 100-12.5 MG tablet    Sig: Take 1 tablet by mouth daily.    Dispense:  90 tablet    Refill:  3  . metoprolol tartrate (LOPRESSOR) 50 MG tablet    Sig: Take 1 tablet (50 mg total) by mouth 2 (two) times daily.    Dispense:  180 tablet    Refill:  0    Please consider 90 day supplies to promote better adherence    Follow-up: Return in about 3 months (around 07/13/2018) for physical exam .    Doristine BosworthZoe A Shreya Lacasse, MD

## 2018-04-13 NOTE — Patient Instructions (Addendum)
   If you have lab work done today you will be contacted with your lab results within the next 2 weeks.  If you have not heard from us then please contact us. The fastest way to get your results is to register for My Chart.   IF you received an x-ray today, you will receive an invoice from [facility]. Please contact [facility] at [phone] with questions or concerns regarding your invoice.   IF you received labwork today, you will receive an invoice from [facility]. Please contact [facility] at [phone] with questions or concerns regarding your invoice.   Our billing staff will not be able to assist you with questions regarding bills from these companies.  You will be contacted with the lab results as soon as they are available. The fastest way to get your results is to activate your My Chart account. Instructions are located on the last page of this paperwork. If you have not heard from us regarding the results in 2 weeks, please contact this office.      Hypertension Hypertension is another name for high blood pressure. High blood pressure forces your heart to work harder to pump blood. This can cause problems over time. There are two numbers in a blood pressure reading. There is a top number (systolic) over a bottom number (diastolic). It is best to have a blood pressure below 120/80. Healthy choices can help lower your blood pressure. You may need medicine to help lower your blood pressure if:  Your blood pressure cannot be lowered with healthy choices.  Your blood pressure is higher than 130/80. Follow these instructions at home: Eating and drinking   If directed, follow the DASH eating plan. This diet includes: ? Filling half of your plate at each meal with fruits and vegetables. ? Filling one quarter of your plate at each meal with whole grains. Whole grains include whole wheat pasta, brown rice, and whole grain bread. ? Eating or drinking low-fat dairy  products, such as skim milk or low-fat yogurt. ? Filling one quarter of your plate at each meal with low-fat (lean) proteins. Low-fat proteins include fish, skinless chicken, eggs, beans, and tofu. ? Avoiding fatty meat, cured and processed meat, or chicken with skin. ? Avoiding premade or processed food.  Eat less than 1,500 mg of salt (sodium) a day.  Limit alcohol use to no more than 1 drink a day for nonpregnant women and 2 drinks a day for men. One drink equals 12 oz of beer, 5 oz of wine, or 1 oz of hard liquor. Lifestyle  Work with your doctor to stay at a healthy weight or to lose weight. Ask your doctor what the best weight is for you.  Get at least 30 minutes of exercise that causes your heart to beat faster (aerobic exercise) most days of the week. This may include walking, swimming, or biking.  Get at least 30 minutes of exercise that strengthens your muscles (resistance exercise) at least 3 days a week. This may include lifting weights or pilates.  Do not use any products that contain nicotine or tobacco. This includes cigarettes and e-cigarettes. If you need help quitting, ask your doctor.  Check your blood pressure at home as told by your doctor.  Keep all follow-up visits as told by your doctor. This is important. Medicines  Take over-the-counter and prescription medicines only as told by your doctor. Follow directions carefully.  Do not skip doses of blood pressure medicine. The medicine   does not work as well if you skip doses. Skipping doses also puts you at risk for problems.  Ask your doctor about side effects or reactions to medicines that you should watch for. Contact a doctor if:  You think you are having a reaction to the medicine you are taking.  You have headaches that keep coming back (recurring).  You feel dizzy.  You have swelling in your ankles.  You have trouble with your vision. Get help right away if:  You get a very bad headache.  You  start to feel confused.  You feel weak or numb.  You feel faint.  You get very bad pain in your: ? Chest. ? Belly (abdomen).  You throw up (vomit) more than once.  You have trouble breathing. Summary  Hypertension is another name for high blood pressure.  Making healthy choices can help lower blood pressure. If your blood pressure cannot be controlled with healthy choices, you may need to take medicine. This information is not intended to replace advice given to you by your health care provider. Make sure you discuss any questions you have with your health care provider. Document Released: 09/24/2007 Document Revised: 03/05/2016 Document Reviewed: 03/05/2016 Elsevier Interactive Patient Education  2019 Elsevier Inc.  

## 2018-04-14 LAB — CMP14+EGFR
ALT: 34 IU/L (ref 0–44)
AST: 27 IU/L (ref 0–40)
Albumin/Globulin Ratio: 1.7 (ref 1.2–2.2)
Albumin: 4.7 g/dL (ref 3.5–5.5)
Alkaline Phosphatase: 66 IU/L (ref 39–117)
BUN/Creatinine Ratio: 9 (ref 9–20)
BUN: 12 mg/dL (ref 6–24)
Bilirubin Total: 0.4 mg/dL (ref 0.0–1.2)
CALCIUM: 9.7 mg/dL (ref 8.7–10.2)
CHLORIDE: 102 mmol/L (ref 96–106)
CO2: 22 mmol/L (ref 20–29)
Creatinine, Ser: 1.27 mg/dL (ref 0.76–1.27)
GFR, EST AFRICAN AMERICAN: 77 mL/min/{1.73_m2} (ref >59)
GFR, EST NON AFRICAN AMERICAN: 66 mL/min/{1.73_m2} (ref >59)
GLUCOSE: 114 mg/dL — AB (ref 65–99)
Globulin, Total: 2.7 g/dL (ref 1.5–4.5)
Potassium: 5 mmol/L (ref 3.5–5.2)
Sodium: 139 mmol/L (ref 134–144)
TOTAL PROTEIN: 7.4 g/dL (ref 6.0–8.5)

## 2018-04-14 LAB — TSH: TSH: 1.15 u[IU]/mL (ref 0.450–4.500)

## 2018-04-14 LAB — LIPID PANEL
CHOL/HDL RATIO: 8.4 ratio — AB (ref 0.0–5.0)
Cholesterol, Total: 334 mg/dL — ABNORMAL HIGH (ref 100–199)
HDL: 40 mg/dL (ref >39)
LDL Calculated: 232 mg/dL — ABNORMAL HIGH (ref 0–99)
Triglycerides: 309 mg/dL — ABNORMAL HIGH (ref 0–149)
VLDL Cholesterol Cal: 62 mg/dL — ABNORMAL HIGH (ref 5–40)

## 2018-04-15 DIAGNOSIS — E785 Hyperlipidemia, unspecified: Secondary | ICD-10-CM | POA: Insufficient documentation

## 2018-05-25 ENCOUNTER — Other Ambulatory Visit: Payer: Self-pay | Admitting: Family Medicine

## 2018-05-25 DIAGNOSIS — F4323 Adjustment disorder with mixed anxiety and depressed mood: Secondary | ICD-10-CM

## 2018-06-28 ENCOUNTER — Other Ambulatory Visit: Payer: Self-pay | Admitting: Urgent Care

## 2018-06-28 DIAGNOSIS — F4323 Adjustment disorder with mixed anxiety and depressed mood: Secondary | ICD-10-CM

## 2018-07-22 ENCOUNTER — Telehealth: Payer: Self-pay | Admitting: Family Medicine

## 2018-07-22 NOTE — Telephone Encounter (Signed)
Copied from CRM [id number]. Topic: Quick Communication - See Telephone Encounter >> Jul 22, 2018 12:54 PM Trula Slade wrote: CRM for notification. See Telephone encounter for: 07/22/18. Patient would like a refill on his cetirizine (ZYRTEC) 10 MG tablet medication and have it sent to his preferred pharmacy [facility] on [address] In [place].

## 2018-07-23 NOTE — Telephone Encounter (Signed)
Was seen on 12/24 but is not on medication list. Please advise.

## 2018-07-25 MED ORDER — CETIRIZINE HCL 10 MG PO TABS: 10.0000 mg | ORAL_TABLET | Freq: Every day | ORAL | 11 refills | Status: DC

## 2018-08-02 ENCOUNTER — Telehealth: Payer: Self-pay | Admitting: Family Medicine

## 2018-08-02 NOTE — Telephone Encounter (Signed)
Copied from CRM [id number]. Topic: Quick Communication - Rx Refill/Question >> Aug 02, 2018  4:23 PM Jens Som A wrote: Medication: cetirizine (ZYRTEC) 10 MG tablet [[id number]] Medication was sent to the wrong pharmacy. Can it be sent to the pharmacy below please?  Has the patient contacted their pharmacy? Yes  (Agent: If no, request that the patient contact the pharmacy for the refill.) (Agent: If yes, when and what did the pharmacy advise?)  Preferred Pharmacy (with phone number or street name):   Agent: Please be advised that RX refills may take up to 3 business days. We ask that you follow-up with your pharmacy.

## 2018-08-03 ENCOUNTER — Other Ambulatory Visit: Payer: Self-pay

## 2018-08-03 MED ORDER — CETIRIZINE HCL 10 MG PO TABS: 10.0000 mg | ORAL_TABLET | Freq: Every day | ORAL | 11 refills | Status: DC

## 2018-08-03 NOTE — Telephone Encounter (Signed)
Cetirizine #30 with 11 refills resent to correct pharmacy [facility] on [address]  Notified pt resent to correct pharmacy. Dgaddy, CMA

## 2018-09-01 ENCOUNTER — Other Ambulatory Visit: Payer: Self-pay | Admitting: Family Medicine

## 2018-09-01 DIAGNOSIS — F4323 Adjustment disorder with mixed anxiety and depressed mood: Secondary | ICD-10-CM

## 2018-09-01 NOTE — Telephone Encounter (Signed)
Requested medication (s) are due for refill today: yes  Requested medication (s) are on the active medication list: yes  Last refill:  05/27/2018  Future visit scheduled: no  Notes to clinic:  Advised of having a 3 month follow up appointment, that was not done.  Requested Prescriptions  Pending Prescriptions Disp Refills   FLUoxetine (PROZAC) 40 MG capsule [Pharmacy Med Name: FLUoxetine HCl 40 MG Oral Capsule] 90 capsule 0    Sig: TAKE 1 CAPSULE BY MOUTH ONCE DAILY     Psychiatry:  Antidepressants - SSRI Passed - 09/01/2018  3:48 PM      Passed - Valid encounter within last 6 months    Recent Outpatient Visits          4 months ago Uncontrolled hypertension   Primary Care at [facility], Manus Rudd, MD   1 year ago Essential hypertension   Primary Care at [place], [place], [place]   2 years ago Essential hypertension   Primary Care at [place], [place], [place]   3 years ago Adjustment disorder with mixed anxiety and depressed mood   Primary Care at [facility], Gwenlyn Found, MD   4 years ago SOB (shortness of breath)   Primary Care at [facility], Gwenlyn Found, MD

## 2018-10-01 NOTE — Telephone Encounter (Signed)
LVM to schedule appt for med refills.

## 2018-10-18 ENCOUNTER — Telehealth: Payer: Self-pay | Admitting: Family Medicine

## 2018-10-18 ENCOUNTER — Other Ambulatory Visit: Payer: Self-pay | Admitting: Family Medicine

## 2018-10-18 DIAGNOSIS — F4323 Adjustment disorder with mixed anxiety and depressed mood: Secondary | ICD-10-CM

## 2018-10-18 NOTE — Telephone Encounter (Signed)
Pt said he needed a prior auth done for his prescription - fluticasone. He is scheduled to come in 7/22

## 2018-10-19 NOTE — Telephone Encounter (Signed)
Please advise 

## 2018-10-21 ENCOUNTER — Other Ambulatory Visit: Payer: Self-pay | Admitting: Family Medicine

## 2018-10-21 DIAGNOSIS — Z889 Allergy status to unspecified drugs, medicaments and biological substances status: Secondary | ICD-10-CM

## 2018-10-21 MED ORDER — FLUTICASONE PROPIONATE 50 MCG/ACT NA SUSP: 2.0000 | Freq: Every day | NASAL | 6 refills | Status: DC

## 2018-10-21 NOTE — Progress Notes (Signed)
Filled -available OTC

## 2018-11-10 ENCOUNTER — Encounter: Payer: Self-pay | Admitting: Family Medicine

## 2018-11-10 ENCOUNTER — Ambulatory Visit (INDEPENDENT_AMBULATORY_CARE_PROVIDER_SITE_OTHER): Payer: Self-pay | Admitting: Family Medicine

## 2018-11-10 ENCOUNTER — Other Ambulatory Visit: Payer: Self-pay

## 2018-11-10 VITALS — BP 186/120 | HR 72 | Temp 98.5°F | Resp 18 | Ht 72.0 in | Wt 232.0 lb

## 2018-11-10 DIAGNOSIS — G4733 Obstructive sleep apnea (adult) (pediatric): Secondary | ICD-10-CM

## 2018-11-10 DIAGNOSIS — I10 Essential (primary) hypertension: Secondary | ICD-10-CM

## 2018-11-10 MED ORDER — ROSUVASTATIN CALCIUM 5 MG PO TABS: 5.0000 mg | ORAL_TABLET | Freq: Every day | ORAL | 3 refills | Status: DC

## 2018-11-10 MED ORDER — CLONIDINE HCL 0.1 MG PO TABS: 0.1000 mg | ORAL_TABLET | Freq: Two times a day (BID) | ORAL | 1 refills | Status: DC

## 2018-11-10 NOTE — Progress Notes (Signed)
Established Patient Office Visit  Subjective:  Patient ID: Cameron Wells, male    DOB: [birth year]  Age: 48 y.o. MRN: [id number]  CC:  Chief Complaint  Patient presents with  . Hypertension    follow up. Wants to talk about sleep apnea and getting a cpap machine  . 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 He reports some issues with attention deficit where he can't remember details.  His concerns started 7-8 years ago  He drinks about 2-3 beers a day 12oz Risk factors include chronic sinus congestions   Past Medical History:  Diagnosis Date  . Allergy   . Anxiety   . Asthma   . Hypertension     No past surgical history on file.  Family History  Problem Relation Age of Onset  . CAD Maternal Grandmother   . Cancer Paternal Grandfather   . Hypertension Maternal Aunt     Social History   Socioeconomic History  . Marital status: Married    Spouse name: Not on file  . Number of children: Not on file  . Years of education: Not on file  . Highest education level: Not on file  Occupational History  . Not on file  Social  Needs  . Financial resource strain: Not on file  . Food insecurity    Worry: Not on file    Inability: Not on file  . Transportation needs    Medical: Not on file    Non-medical: Not on file  Tobacco Use  . Smoking status: Former Games developermoker  . Smokeless tobacco: Never Used  Substance and Sexual Activity  . Alcohol use: Yes    Alcohol/week: 0.0 standard drinks  . Drug use: No  . Sexual activity: Not on file  Lifestyle  . Physical activity    Days per week: Not on file    Minutes per session: Not on file  . Stress: Not on file  Relationships  . Social Musicianconnections    Talks on phone: Not on file    Gets together: Not on file    Attends religious service: Not on file    Active member of club or organization: Not on file    Attends meetings of clubs or organizations: Not on file    Relationship status: Not on file  . Intimate partner violence    Fear of current or ex partner: Not on file    Emotionally abused: Not on file    Physically abused: Not on file    Forced sexual activity:  Not on file  Other Topics Concern  . Not on file  Social History Narrative  . Not on file    Outpatient Medications Prior to Visit  Medication Sig Dispense Refill  . cetirizine (ZYRTEC) 10 MG tablet Take 1 tablet (10 mg total) by mouth daily. 30 tablet 11  . FLUoxetine (PROZAC) 40 MG capsule Take 1 capsule by mouth once daily 30 capsule 0  . fluticasone (FLONASE) 50 MCG/ACT nasal spray Place 2 sprays into both nostrils daily. 16 g 6  . losartan-hydrochlorothiazide (HYZAAR) 100-12.5 MG tablet Take 1 tablet by mouth daily. 90 tablet 3  . metoprolol tartrate (LOPRESSOR) 50 MG tablet Take 1 tablet (50 mg total) by mouth 2 (two) times daily. 180 tablet 0  . omeprazole (PRILOSEC OTC) 20 MG tablet Take 20 mg by mouth daily.    . fexofenadine-pseudoephedrine (ALLEGRA-D 24) 180-240 MG per 24 hr tablet Take 1 tablet by mouth daily.     No facility-administered medications prior to visit.     Allergies   Allergen Reactions  . Penicillins Anaphylaxis    ROS Review of Systems    Objective:    Physical Exam  BP (!) 186/120 (BP Location: Left Arm, Patient Position: Sitting, Cuff Size: Large)   Pulse 72   Temp 98.5 F (36.9 C) (Oral)   Resp 18   Ht 6' (1.829 m)   Wt 232 lb (105.2 kg)   SpO2 96%   BMI 31.46 kg/m  Wt Readings from Last 3 Encounters:  11/10/18 232 lb (105.2 kg)  04/13/18 225 lb 6.4 oz (102.2 kg)  01/29/17 218 lb (98.9 kg)   Epworth Sleepiness Score 21  Physical Exam  Constitutional: Oriented to person, place, and time. Appears well-developed and well-nourished.  HENT:  Head: Normocephalic and atraumatic.  Eyes: Conjunctivae and EOM are normal.  Neck: with mass, normal size, no thyromegaly Cardiovascular: Normal rate, regular rhythm, normal heart sounds and intact distal pulses.  No murmur heard. Pulmonary/Chest: Effort normal and breath sounds normal. No stridor. No respiratory distress. Has no wheezes.  Neurological: Is alert and oriented to person, place, and time.  Skin: Skin is warm. Capillary refill takes less than 2 seconds.  Psychiatric: Has a normal mood and affect. Behavior is normal. Judgment and thought content normal.    Health Maintenance Due  Topic Date Due  . HIV Screening  09/07/1984  . TETANUS/TDAP  09/07/1988    There are no preventive care reminders to display for this patient.  Lab Results  Component Value Date   TSH 1.150 04/13/2018   Lab Results  Component Value Date   WBC 8.9 06/07/2014   HGB 14.9 06/07/2014   HCT 43.0 06/07/2014   MCV 89.2 06/07/2014   PLT 187 06/07/2014   Lab Results  Component Value Date   NA 139 04/13/2018   K 5.0 04/13/2018   CO2 22 04/13/2018   GLUCOSE 114 (H) 04/13/2018   BUN 12 04/13/2018   CREATININE 1.27 04/13/2018   BILITOT 0.4 04/13/2018   ALKPHOS 66 04/13/2018   AST 27 04/13/2018   ALT 34 04/13/2018   PROT 7.4 04/13/2018   ALBUMIN 4.7 04/13/2018   CALCIUM 9.7 04/13/2018    ANIONGAP 8 06/07/2014   Lab Results  Component Value Date   CHOL 334 (H) 04/13/2018   Lab Results  Component Value Date   HDL 40 04/13/2018   Lab Results  Component Value Date   LDLCALC 232 (H) 04/13/2018   Lab Results  Component Value Date  TRIG 309 (H) 04/13/2018   Lab Results  Component Value Date   CHOLHDL 8.4 (H) 04/13/2018   No results found for: HGBA1C    Assessment & Plan:   Problem List Items Addressed This Visit      Cardiovascular and Mediastinum   Uncontrolled hypertension - Primary   Relevant Medications   cloNIDine (CATAPRES) 0.1 MG tablet   rosuvastatin (CRESTOR) 5 MG tablet   Other Relevant Orders   Ambulatory referral to Sleep Studies    Other Visit Diagnoses    Obstructive sleep apnea syndrome       Relevant Orders   Ambulatory referral to Sleep Studies     Patient with Epworth score and audio recording of his sleep apnea Should follow up for sleep testing   Would advise pt to also stop vaping   Discussed how to take his clonidine Start with 1 tablet once a day and monitor blood pressure and then increase to twice a day of Clonidine He should continue his metoprolol, lisinipril-hctz   He will need ophthalmology and Cardiology if there is not improvement on Clonidine    Meds ordered this encounter  Medications  . cloNIDine (CATAPRES) 0.1 MG tablet    Sig: Take 1 tablet (0.1 mg total) by mouth 2 (two) times daily.    Dispense:  60 tablet    Refill:  1  . rosuvastatin (CRESTOR) 5 MG tablet    Sig: Take 1 tablet (5 mg total) by mouth daily.    Dispense:  90 tablet    Refill:  3    Follow-up: No follow-ups on file.    Doristine BosworthZoe A Indigo Chaddock, MD

## 2018-11-10 NOTE — Patient Instructions (Addendum)
If you have lab work done today you will be contacted with your lab results within the next 2 weeks.  If you have not heard from [place] then please contact us. The fastest way to get your results is to register for My Chart.   IF you received an x-ray today, you will receive an invoice from [facility]. Please contact [facility] at [phone] with questions or concerns regarding your invoice.   IF you received labwork today, you will receive an invoice from [facility]. Please contact [facility] at [phone] with questions or concerns regarding your invoice.   Our billing staff will not be able to assist you with questions regarding bills from these companies.  You will be contacted with the lab results as soon as they are available. The fastest way to get your results is to activate your My Chart account. Instructions are located on the last page of this paperwork. If you have not heard from [place] regarding the results in 2 weeks, please contact this office.     Sleep Apnea Sleep apnea is a condition in which breathing pauses or becomes shallow during sleep. Episodes of sleep apnea usually last 10 seconds or longer, and they may occur as many as 20 times an hour. Sleep apnea disrupts your sleep and keeps your body from getting the rest that it needs. This condition can increase your risk of certain health problems, including:  Heart attack.  Stroke.  Obesity.  Diabetes.  Heart failure.  Irregular heartbeat. What are the causes? There are three kinds of sleep apnea:  Obstructive sleep apnea. This kind is caused by a blocked or collapsed airway.  Central sleep apnea. This kind happens when the part of the brain that controls breathing does not send the correct signals to the muscles that control breathing.  Mixed sleep apnea. This is a combination of obstructive and central sleep apnea. The most common cause of this condition is a collapsed or blocked airway. An  airway can collapse or become blocked if:  Your throat muscles are abnormally relaxed.  Your tongue and tonsils are larger than normal.  You are overweight.  Your airway is smaller than normal. What increases the risk? You are more likely to develop this condition if you:  Are overweight.  Smoke.  Have a smaller than normal airway.  Are elderly.  Are male.  Drink alcohol.  Take sedatives or tranquilizers.  Have a family history of sleep apnea. What are the signs or symptoms? Symptoms of this condition include:  Trouble staying asleep.  Daytime sleepiness and tiredness.  Irritability.  Loud snoring.  Morning headaches.  Trouble concentrating.  Forgetfulness.  Decreased interest in sex.  Unexplained sleepiness.  Mood swings.  Personality changes.  Feelings of depression.  Waking up often during the night to urinate.  Dry mouth.  Sore throat. How is this diagnosed? This condition may be diagnosed with:  A medical history.  A physical exam.  A series of tests that are done while you are sleeping (sleep study). These tests are usually done in a sleep lab, but they may also be done at home. How is this treated? Treatment for this condition aims to restore normal breathing and to ease symptoms during sleep. It may involve managing health issues that can affect breathing, such as high blood pressure or obesity. Treatment may include:  Sleeping on your side.  Using a decongestant if you have nasal congestion.  Avoiding the use of depressants, including  alcohol, sedatives, and narcotics.  Losing weight if you are overweight.  Making changes to your diet.  Quitting smoking.  Using a device to open your airway while you sleep, such as: ? An oral appliance. This is a custom-made mouthpiece that shifts your lower jaw forward. ? A continuous positive airway pressure (CPAP) device. This device blows air through a mask when you breathe out  (exhale). ? A nasal expiratory positive airway pressure (EPAP) device. This device has valves that you put into each nostril. ? A bi-level positive airway pressure (BPAP) device. This device blows air through a mask when you breathe in (inhale) and breathe out (exhale).  Having surgery if other treatments do not work. During surgery, excess tissue is removed to create a wider airway. It is important to get treatment for sleep apnea. Without treatment, this condition can lead to:  High blood pressure.  Coronary artery disease.  In men, an inability to achieve or maintain an erection (impotence).  Reduced thinking abilities. Follow these instructions at home: Lifestyle  Make any lifestyle changes that your health care provider recommends.  Eat a healthy, well-balanced diet.  Take steps to lose weight if you are overweight.  Avoid using depressants, including alcohol, sedatives, and narcotics.  Do not use any products that contain nicotine or tobacco, such as cigarettes, e-cigarettes, and chewing tobacco. If you need help quitting, ask your health care provider. General instructions  Take over-the-counter and prescription medicines only as told by your health care provider.  If you were given a device to open your airway while you sleep, use it only as told by your health care provider.  If you are having surgery, make sure to tell your health care provider you have sleep apnea. You may need to bring your device with you.  Keep all follow-up visits as told by your health care provider. This is important. Contact a health care provider if:  The device that you received to open your airway during sleep is uncomfortable or does not seem to be working.  Your symptoms do not improve.  Your symptoms get worse. Get help right away if:  You develop: ? Chest pain. ? Shortness of breath. ? Discomfort in your back, arms, or stomach.  You have: ? Trouble speaking. ? Weakness on  one side of your body. ? Drooping in your face. These symptoms may represent a serious problem that is an emergency. Do not wait to see if the symptoms will go away. Get medical help right away. Call your local emergency services (911 in the [place] Do not drive yourself to the hospital. Summary  Sleep apnea is a condition in which breathing pauses or becomes shallow during sleep.  The most common cause is a collapsed or blocked airway.  The goal of treatment is to restore normal breathing and to ease symptoms during sleep. This information is not intended to replace advice given to you by your health care provider. Make sure you discuss any questions you have with your health care provider. Document Released: 03/28/2002 Document Revised: 01/22/2018 Document Reviewed: 12/01/2017 Elsevier Patient Education  2020 [employer]

## 2018-11-23 ENCOUNTER — Telehealth: Payer: Self-pay | Admitting: Family Medicine

## 2018-11-23 DIAGNOSIS — I10 Essential (primary) hypertension: Secondary | ICD-10-CM

## 2018-11-23 DIAGNOSIS — G4733 Obstructive sleep apnea (adult) (pediatric): Secondary | ICD-10-CM

## 2018-11-23 NOTE — Telephone Encounter (Signed)
Alvis Lemmings called from Sleep center reg current referral. Order was entered as referral needs to be entered as an order so they can call pt and schedule . Any questions please call Alvis Lemmings at [place] or Karna Christmas can confirm order is there FR

## 2018-11-23 NOTE — Telephone Encounter (Signed)
Please advise. Dgaddy, CMA 

## 2018-11-24 NOTE — Telephone Encounter (Signed)
Correction made and study ordered.

## 2018-11-26 ENCOUNTER — Other Ambulatory Visit: Payer: Self-pay | Admitting: Family Medicine

## 2018-11-26 DIAGNOSIS — F4323 Adjustment disorder with mixed anxiety and depressed mood: Secondary | ICD-10-CM

## 2018-11-26 NOTE — Telephone Encounter (Signed)
Medication Refill - Medication: FLUoxetine (PROZAC) 40 MG capsule Pt is out of this medication   Has the patient contacted their pharmacy? No. (Agent: If no, request that the patient contact the pharmacy for the refill.) (Agent: If yes, when and what did the pharmacy advise?)  Preferred Pharmacy (with phone number or street name):  [place], [place] [phone] (Phone) [phone] (Fax)     Agent: Please be advised that RX refills may take up to 3 business days. We ask that you follow-up with your pharmacy.

## 2018-11-26 NOTE — Telephone Encounter (Signed)
Requested medication (s) are due for refill today:   Yes  Requested medication (s) are on the active medication list:   Yes  Future visit scheduled:   Yes with Dr Nolon Rod in 2 months   Last ordered: 10/21/2018  #30  0 refills with a note that he must keep his appt, no more refills.    Requested Prescriptions  Pending Prescriptions Disp Refills   FLUoxetine (PROZAC) 40 MG capsule 30 capsule 0    Sig: Take 1 capsule (40 mg total) by mouth daily.     Psychiatry:  Antidepressants - SSRI Failed - 11/26/2018 10:04 AM      Failed - Completed PHQ-2 or PHQ-9 in the last 360 days.      Passed - Valid encounter within last 6 months    Recent Outpatient Visits          2 weeks ago Uncontrolled hypertension   [facility] at [place], [place]   7 months ago Uncontrolled hypertension   [facility] at [facility], Arlie Solomons, MD   1 year ago Essential hypertension   Primary Care at [place], [place], [place]   2 years ago Essential hypertension   Primary [facility] at [place], [place], [place]   4 years ago Adjustment disorder with mixed anxiety and depressed mood   Primary Care at [facility], Gay Filler, MD      Future Appointments            In 2 months Forrest Moron, MD Primary Care at [place], [facility]

## 2018-11-29 NOTE — Telephone Encounter (Signed)
Patient is requesting a refill of the following medications: Requested Prescriptions   Pending Prescriptions Disp Refills  . FLUoxetine (PROZAC) 40 MG capsule 30 capsule 0    Sig: Take 1 capsule (40 mg total) by mouth daily.    Date of patient request: 11/26/18 Last office visit: 10/21/18 Date of last refill: 10/21/18 Last refill amount: 30 Follow up time period per chart: 02/09/19

## 2018-11-30 ENCOUNTER — Telehealth: Payer: Self-pay | Admitting: Family Medicine

## 2018-11-30 MED ORDER — FLUOXETINE HCL 40 MG PO CAPS: 40.0000 mg | ORAL_CAPSULE | Freq: Every day | ORAL | 3 refills | Status: DC

## 2018-11-30 NOTE — Telephone Encounter (Signed)
[place] called to report that a sleep order study went to [facility] and she needs one put in for [place] to properly schedule patient. Please Arnoldo Hooker

## 2018-12-06 ENCOUNTER — Other Ambulatory Visit (HOSPITAL_COMMUNITY)
Admission: RE | Admit: 2018-12-06 | Discharge: 2018-12-06 | Disposition: A | Discharge status: Home / Self Care | Payer: HRSA Program | Source: Ambulatory Visit | Attending: Internal Medicine | Admitting: Internal Medicine

## 2018-12-06 DIAGNOSIS — Z01812 Encounter for preprocedural laboratory examination: Secondary | ICD-10-CM | POA: Diagnosis present

## 2018-12-06 DIAGNOSIS — Z20828 Contact with and (suspected) exposure to other viral communicable diseases: Secondary | ICD-10-CM | POA: Insufficient documentation

## 2018-12-06 LAB — SARS CORONAVIRUS 2 (TAT 6-24 HRS): SARS Coronavirus 2: NEGATIVE

## 2018-12-07 ENCOUNTER — Other Ambulatory Visit: Payer: Self-pay

## 2018-12-07 ENCOUNTER — Ambulatory Visit (HOSPITAL_BASED_OUTPATIENT_CLINIC_OR_DEPARTMENT_OTHER): Payer: Self-pay | Attending: Family Medicine | Admitting: Internal Medicine

## 2018-12-07 DIAGNOSIS — G4733 Obstructive sleep apnea (adult) (pediatric): Secondary | ICD-10-CM | POA: Insufficient documentation

## 2018-12-07 DIAGNOSIS — I10 Essential (primary) hypertension: Secondary | ICD-10-CM | POA: Insufficient documentation

## 2018-12-07 DIAGNOSIS — Z79899 Other long term (current) drug therapy: Secondary | ICD-10-CM | POA: Insufficient documentation

## 2018-12-08 ENCOUNTER — Telehealth: Payer: Self-pay | Admitting: Family Medicine

## 2018-12-08 NOTE — Telephone Encounter (Signed)
Patient requesting a call back from Dr. Nolon Rod to review sleep study and discuss.

## 2018-12-09 NOTE — Telephone Encounter (Signed)
Pt. Came into office looking for advice regarding sleep study, pt. Was informed Dr. Nolon Rod would take a look at it as soon as possible and would contact him when appropriate.

## 2018-12-10 NOTE — Telephone Encounter (Signed)
Please let the patient know that I do not have his sleep study as yet.  Once I get that then I can order his CPAP and his work note to return. Please send him an office not if he needs one. I will wait until Monday and if it is not compelted then will get the sleep study faxed to  [place].   Please notify the patient. Please provide a letter to remain out of work until 12/15/2018.

## 2018-12-10 NOTE — Telephone Encounter (Signed)
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  Told he need a cpap machine and this would greatly improve his symptoms and get him to normalcy.  Pt says his job is ok with coming back if the doctor says its ok and he wants to know it will be safe as well.  And if cpap will help he would like one asap.  Please advise.  I advise I will send message for your recommendations and contact him back today if I can.  Pt agreeable.  Call back # [phone]

## 2018-12-10 NOTE — Telephone Encounter (Signed)
Spoke with pt advised dr Nolon Rod has not received his sleep study as of yet.  Once we receive it she will order cpap and give work note to return.  We will wait until Monday 12/13/18 and if it is not received (sleep study) we will send fax request for it.  Per dr Nolon Rod we will give pt a work note keeping him out until 12/15/18, pt agreeable and will await further instructions from our office.

## 2018-12-12 DIAGNOSIS — I10 Essential (primary) hypertension: Secondary | ICD-10-CM

## 2018-12-12 NOTE — Procedures (Signed)
   Patient Name: Cameron Wells, Cameron Wells Date: 12/07/2018 Gender: Male D.O.B: Dec 02, 1970 Age (years): 84 Referring Provider: Gwendolyn Fill A Stallings Height (inches): 71 Interpreting Physician: Baird Lyons MD, ABSM Weight (lbs): 230 RPSGT: Zadie Rhine BMI: 32 MRN: [id number] Neck Size: 18.00  CLINICAL INFORMATION Sleep Study Type: NPSG Indication for sleep study: Snoring, Witnesses Apnea / Gasping During Sleep Epworth Sleepiness Score: 23  SLEEP STUDY TECHNIQUE As per the AASM Manual for the Scoring of Sleep and Associated Events v2.3 (April 2016) with a hypopnea requiring 4% desaturations.  The channels recorded and monitored were frontal, central and occipital EEG, electrooculogram (EOG), submentalis EMG (chin), nasal and oral airflow, thoracic and abdominal wall motion, anterior tibialis EMG, snore microphone, electrocardiogram, and pulse oximetry.  MEDICATIONS Medications self-administered by patient taken the night of the study : CATAPRES, CRESTOR, [place] The study was initiated at 9:37:16 PM and ended at 4:07:30 AM.  Sleep onset time was 18.7 minutes and the sleep efficiency was 80.8%. The total sleep time was 315.5 minutes.  Stage REM latency was 196.5 minutes.  The patient spent 47.23% of the night in stage N1 sleep, 37.24% in stage N2 sleep, 0.16% in stage N3 and 15.4% in REM.  Alpha intrusion was absent.  Supine sleep was 87.37%.  RESPIRATORY PARAMETERS The overall apnea/hypopnea index (AHI) was 69.0 per hour. There were 269 total apneas, including 152 obstructive, 15 central and 102 mixed apneas. There were 94 hypopneas and 8 RERAs.  The AHI during Stage REM sleep was 52.0 per hour.  AHI while supine was 69.2 per hour.  The mean oxygen saturation was 90.18%. The minimum SpO2 during sleep was 71.00%.  loud snoring was noted during this study.  CARDIAC DATA The 2 lead EKG demonstrated sinus rhythm. The mean heart rate was 70.92 beats per minute.  Other EKG findings include: None.  LEG MOVEMENT DATA The total PLMS were 0 with a resulting PLMS index of 0.00. Associated arousal with leg movement index was 0.0 .  IMPRESSIONS - Severe obstructive sleep apnea occurred during this study (AHI = 69.0/h). - No significant central sleep apnea occurred during this study (CAI = 2.9/h). - Moderate oxygen desaturation was noted during this study (Min O2 = 71.00%). Mean sat 90.1%. - The patient snored with loud snoring volume. - No cardiac abnormalities were noted during this study. - Clinically significant periodic limb movements did not occur during sleep. No significant associated arousals.  DIAGNOSIS - Obstructive Sleep Apnea (327.23 [G47.33 ICD-10]  RECOMMENDATIONS - Suggest CPAP titration sleep study or autopap. Other options would be based on clinical judgment. - Be careful with alcohol, sedatives and other CNS depressants that may worsen sleep apnea and disrupt normal sleep architecture. - Sleep hygiene should be reviewed to assess factors that may improve sleep quality. - Weight management and regular exercise should be initiated or continued if appropriate.  [Electronically signed] 12/12/2018 11:06 AM  Baird Lyons MD, ABSM Diplomate, American Board of Sleep Medicine   NPI: [id number]                          [place]  ELECTRONICALLY SIGNED ON:  12/12/2018, 10:59 AM [place] PH: [phone]   FX: [phone] [place]

## 2018-12-12 NOTE — Addendum Note (Signed)
Addended by: Delia Chimes A on: 12/12/2018 11:41 PM   Modules accepted: Orders

## 2018-12-12 NOTE — Addendum Note (Signed)
Addended by: Delia Chimes A on: 12/12/2018 11:36 PM   Modules accepted: Orders

## 2018-12-16 ENCOUNTER — Other Ambulatory Visit (HOSPITAL_COMMUNITY)
Admission: RE | Admit: 2018-12-16 | Discharge: 2018-12-16 | Disposition: A | Discharge status: Home / Self Care | Payer: HRSA Program | Source: Ambulatory Visit | Attending: Internal Medicine | Admitting: Internal Medicine

## 2018-12-16 DIAGNOSIS — Z20828 Contact with and (suspected) exposure to other viral communicable diseases: Secondary | ICD-10-CM | POA: Insufficient documentation

## 2018-12-16 DIAGNOSIS — Z01812 Encounter for preprocedural laboratory examination: Secondary | ICD-10-CM | POA: Diagnosis present

## 2018-12-16 LAB — SARS CORONAVIRUS 2 (TAT 6-24 HRS): SARS Coronavirus 2: NEGATIVE

## 2018-12-19 ENCOUNTER — Other Ambulatory Visit: Payer: Self-pay

## 2018-12-19 ENCOUNTER — Ambulatory Visit (HOSPITAL_BASED_OUTPATIENT_CLINIC_OR_DEPARTMENT_OTHER): Payer: Self-pay | Attending: Family Medicine | Admitting: Internal Medicine

## 2018-12-19 VITALS — Ht 71.0 in | Wt 230.0 lb

## 2018-12-19 DIAGNOSIS — G4733 Obstructive sleep apnea (adult) (pediatric): Secondary | ICD-10-CM | POA: Insufficient documentation

## 2018-12-19 DIAGNOSIS — I10 Essential (primary) hypertension: Secondary | ICD-10-CM | POA: Insufficient documentation

## 2018-12-21 ENCOUNTER — Telehealth: Payer: Self-pay

## 2018-12-21 ENCOUNTER — Telehealth: Payer: Self-pay | Admitting: Family Medicine

## 2018-12-21 DIAGNOSIS — Z0289 Encounter for other administrative examinations: Secondary | ICD-10-CM

## 2018-12-21 NOTE — Telephone Encounter (Signed)
fmla form put at nurses station for [place]

## 2018-12-21 NOTE — Telephone Encounter (Signed)
Pt dropped of FMLA paperwork for a CPAP machine. Pt has to have a CPAP machine before he is allowed to go back to work. Was told that Dr. Pamella Pert is taking care of Dr. Fredirick Lathe while she is out of the office. I have placed the paperwork in Dr. Ardyth Gal box at nurses station.

## 2018-12-23 ENCOUNTER — Telehealth: Payer: Self-pay

## 2018-12-23 NOTE — Telephone Encounter (Signed)
Forms given to [place] for completion, copy at nurse station

## 2018-12-25 DIAGNOSIS — I10 Essential (primary) hypertension: Secondary | ICD-10-CM

## 2018-12-25 NOTE — Procedures (Signed)
Patient Name: Cameron Wells, Cameron Wells Date: 12/19/2018 Gender: Male D.O.B: [birth year] Age (years): 17 Referring Provider: Gwendolyn Fill A Stallings Height (inches): 71 Interpreting Physician: Baird Lyons MD, ABSM Weight (lbs): 230 RPSGT: Zadie Rhine BMI: 32 MRN: [id number] Neck Size: 18.00  CLINICAL INFORMATION The patient is referred for a CPAP titration to treat sleep apnea.  Date of NPSG, Split Night or HST:  NPSG 12/07/2018   AHI 69/ hr, desaturation to 71%, body weight 230 lbs  SLEEP STUDY TECHNIQUE As per the AASM Manual for the Scoring of Sleep and Associated Events v2.3 (April 2016) with a hypopnea requiring 4% desaturations.  The channels recorded and monitored were frontal, central and occipital EEG, electrooculogram (EOG), submentalis EMG (chin), nasal and oral airflow, thoracic and abdominal wall motion, anterior tibialis EMG, snore microphone, electrocardiogram, and pulse oximetry. Continuous positive airway pressure (CPAP) was initiated at the beginning of the study and titrated to treat sleep-disordered breathing.  MEDICATIONS Medications self-administered by patient taken the night of the study : CATAPRES, CRESTOR, LOPERESSOR  TECHNICIAN COMMENTS Comments added by technician: NO RESTROOM VISTED Comments added by scorer: N/A RESPIRATORY PARAMETERS Optimal PAP Pressure (cm): 14 AHI at Optimal Pressure (/hr): 0.4 Overall Minimal O2 (%): 85.0 Supine % at Optimal Pressure (%): 0 Minimal O2 at Optimal Pressure (%): 92.0   SLEEP ARCHITECTURE The study was initiated at 9:35:54 PM and ended at 3:29:18 AM.  Sleep onset time was 16.0 minutes and the sleep efficiency was 85.2%%. The total sleep time was 301 minutes.  The patient spent 1.0%% of the night in stage N1 sleep, 24.3%% in stage N2 sleep, 12.1%% in stage N3 and 62.6% in REM.Stage REM latency was 66.5 minutes  Wake after sleep onset was 36.4. Alpha intrusion was absent. Supine sleep was 40.70%.  CARDIAC DATA The 2 lead  EKG demonstrated sinus rhythm. The mean heart rate was 86.1 beats per minute. Other EKG findings include: None.  LEG MOVEMENT DATA The total Periodic Limb Movements of Sleep (PLMS) were 0. The PLMS index was 0.0. A PLMS index of <15 is considered normal in adults.  IMPRESSIONS - The optimal PAP pressure was 14 cm of water. - Central sleep apnea was not noted during this titration (CAI = 0.0/h). - Moderate oxygen desaturations were observed during this titration (min O2 = 85.0%). Min sat at CPAP 14 was 92%. - No snoring was audible during this study. - No cardiac abnormalities were observed during this study. - Clinically significant periodic limb movements were not noted during this study.   DIAGNOSIS - Obstructive Sleep Apnea (327.23 [G47.33 ICD-10])  RECOMMENDATIONS - Trial of CPAP therapy on 14 cm H2O or autopap 10-20. - Patient used a Medium size Resmed Full Face Mask AirFit F20 mask and heated humidification. - Be careful with alcohol, sedatives and other CNS depressants that may worsen sleep apnea and disrupt normal sleep architecture. - Sleep hygiene should be reviewed to assess factors that may improve sleep quality. - Weight management and regular exercise should be initiated or continued.  [Electronically signed] 12/25/2018 11:40 AM  Baird Lyons MD, ABSM Diplomate, American Board of Sleep Medicine   NPI: [id number]                          [place] [place] [facility]  ELECTRONICALLY SIGNED ON:  12/25/2018, 11:37 AM [place] PH: [phone]   FX: [phone] [place]

## 2018-12-28 ENCOUNTER — Telehealth: Payer: Self-pay

## 2018-12-28 NOTE — Telephone Encounter (Signed)
Spoke with pt, he will come pick up fmla forms tomorrow between 8-5. Forms were prepared for pick up per pt, copied and sent to scan.

## 2019-01-08 ENCOUNTER — Other Ambulatory Visit: Payer: Self-pay | Admitting: Family Medicine

## 2019-01-08 DIAGNOSIS — I10 Essential (primary) hypertension: Secondary | ICD-10-CM

## 2019-02-09 ENCOUNTER — Ambulatory Visit: Payer: Self-pay | Admitting: Family Medicine

## 2019-03-14 ENCOUNTER — Ambulatory Visit (INDEPENDENT_AMBULATORY_CARE_PROVIDER_SITE_OTHER): Payer: Self-pay | Admitting: Family Medicine

## 2019-03-14 ENCOUNTER — Other Ambulatory Visit: Payer: Self-pay

## 2019-03-14 ENCOUNTER — Encounter: Payer: Self-pay | Admitting: Family Medicine

## 2019-03-14 VITALS — BP 172/110 | HR 86 | Temp 98.5°F | Ht 72.0 in | Wt 236.6 lb

## 2019-03-14 DIAGNOSIS — E785 Hyperlipidemia, unspecified: Secondary | ICD-10-CM

## 2019-03-14 DIAGNOSIS — I10 Essential (primary) hypertension: Secondary | ICD-10-CM

## 2019-03-14 DIAGNOSIS — Z760 Encounter for issue of repeat prescription: Secondary | ICD-10-CM

## 2019-03-14 MED ORDER — METOPROLOL TARTRATE 50 MG PO TABS: 50.0000 mg | ORAL_TABLET | Freq: Two times a day (BID) | ORAL | 0 refills | Status: DC

## 2019-03-14 NOTE — Progress Notes (Signed)
Established Patient Office Visit  Subjective:  Patient ID: Cameron Wells, male    DOB: December 23, 1970  Age: 48 y.o. MRN: [id number]  CC:  Chief Complaint  Patient presents with  . Hypertension  . Medication Refill    out of metprolol out to 2-3 days     HPI Cameron Wells presents for   Hypertension: Patient here for follow-up of elevated blood pressure.  He ran out his medications He states that he has not checked him home blood pressure. He states that he would take clonidine and it made him very tired so he would not take it  He has not taken clonidine in a month.  He reports that he has not been exercising and does not eat regular meals. Just once a day.   BP Readings from Last 3 Encounters:  03/14/19 (!) 172/110  11/10/18 (!) 186/120  04/13/18 (!) 174/124     Past Medical History:  Diagnosis Date  . Allergy   . Anxiety   . Asthma   . Hypertension     No past surgical history on file.  Family History  Problem Relation Age of Onset  . CAD Maternal Grandmother   . Cancer Paternal Grandfather   . Hypertension Maternal Aunt     Social History   Socioeconomic History  . Marital status: Married    Spouse name: Not on file  . Number of children: Not on file  . Years of education: Not on file  . Highest education level: Not on file  Occupational History  . Not on file  Social Needs  . Financial resource strain: Not on file  . Food insecurity    Worry: Not on file    Inability: Not on file  . Transportation needs    Medical: Not on file    Non-medical: Not on file  Tobacco Use  . Smoking status: Former Games developermoker  . Smokeless tobacco: Never Used  Substance and Sexual Activity  . Alcohol use: Yes    Alcohol/week: 0.0 standard drinks  . Drug use: No  . Sexual activity: Not on file  Lifestyle  . Physical activity    Days per week: Not on file    Minutes per session: Not on file  . Stress: Not on file  Relationships  . Social Musicianconnections    Talks on phone: Not  on file    Gets together: Not on file    Attends religious service: Not on file    Active member of club or organization: Not on file    Attends meetings of clubs or organizations: Not on file    Relationship status: Not on file  . Intimate partner violence    Fear of current or ex partner: Not on file    Emotionally abused: Not on file    Physically abused: Not on file    Forced sexual activity: Not on file  Other Topics Concern  . Not on file  Social History Narrative  . Not on file    Outpatient Medications Prior to Visit  Medication Sig Dispense Refill  . cetirizine (ZYRTEC) 10 MG tablet Take 1 tablet (10 mg total) by mouth daily. 30 tablet 11  . fexofenadine-pseudoephedrine (ALLEGRA-D 24) 180-240 MG per 24 hr tablet Take 1 tablet by mouth daily.    [place] Kitchen. FLUoxetine (PROZAC) 40 MG capsule Take 1 capsule (40 mg total) by mouth daily. 90 capsule 3  . fluticasone (FLONASE) 50 MCG/ACT nasal spray Place 2 sprays into both nostrils  daily. 16 g 6  . losartan-hydrochlorothiazide (HYZAAR) 100-12.5 MG tablet Take 1 tablet by mouth daily. 90 tablet 3  . metoprolol tartrate (LOPRESSOR) 50 MG tablet Take 1 tablet by mouth twice daily 180 tablet 0  . omeprazole (PRILOSEC OTC) 20 MG tablet Take 20 mg by mouth daily.    . rosuvastatin (CRESTOR) 5 MG tablet Take 1 tablet (5 mg total) by mouth daily. 90 tablet 3  . cloNIDine (CATAPRES) 0.1 MG tablet Take 1 tablet (0.1 mg total) by mouth 2 (two) times daily. (Patient not taking: Reported on 03/14/2019) 60 tablet 1   No facility-administered medications prior to visit.     Allergies  Allergen Reactions  . Penicillins Anaphylaxis    ROS Review of Systems Review of Systems  Constitutional: Negative for activity change, appetite change, chills and fever.  HENT: Negative for congestion, nosebleeds, trouble swallowing and voice change.   Respiratory: Negative for cough, shortness of breath and wheezing.   Gastrointestinal: Negative for diarrhea,  nausea and vomiting.  Genitourinary: Negative for difficulty urinating, dysuria, flank pain and hematuria.  Musculoskeletal: Negative for back pain, joint swelling and neck pain.  Neurological: Negative for dizziness, speech difficulty, light-headedness and numbness.  See HPI. All other review of systems negative.     Objective:    Physical Exam  BP (!) 172/110   Pulse 86   Temp 98.5 F (36.9 C) (Oral)   Ht 6' (1.829 m)   Wt 236 lb 9.6 oz (107.3 kg)   SpO2 95%   BMI 32.09 kg/m  Wt Readings from Last 3 Encounters:  03/14/19 236 lb 9.6 oz (107.3 kg)  12/19/18 230 lb (104.3 kg)  12/07/18 230 lb (104.3 kg)   Physical Exam  Constitutional: Oriented to person, place, and time. Appears well-developed and well-nourished.  HENT:  Head: Normocephalic and atraumatic.  Eyes: Conjunctivae and EOM are normal.  Cardiovascular: Normal rate, regular rhythm, normal heart sounds and intact distal pulses.  No murmur heard. Pulmonary/Chest: Effort normal and breath sounds normal. No stridor. No respiratory distress. Has no wheezes.  Neurological: Is alert and oriented to person, place, and time.  Skin: Skin is warm. Capillary refill takes less than 2 seconds.  Psychiatric: Has a normal mood and affect. Behavior is normal. Judgment and thought content normal.    Health Maintenance Due  Topic Date Due  . HIV Screening  09/07/1984  . TETANUS/TDAP  09/07/1988  . INFLUENZA VACCINE  11/20/2018    There are no preventive care reminders to display for this patient.  Lab Results  Component Value Date   TSH 1.150 04/13/2018   Lab Results  Component Value Date   WBC 8.9 06/07/2014   HGB 14.9 06/07/2014   HCT 43.0 06/07/2014   MCV 89.2 06/07/2014   PLT 187 06/07/2014   Lab Results  Component Value Date   NA 139 04/13/2018   K 5.0 04/13/2018   CO2 22 04/13/2018   GLUCOSE 114 (H) 04/13/2018   BUN 12 04/13/2018   CREATININE 1.27 04/13/2018   BILITOT 0.4 04/13/2018   ALKPHOS 66  04/13/2018   AST 27 04/13/2018   ALT 34 04/13/2018   PROT 7.4 04/13/2018   ALBUMIN 4.7 04/13/2018   CALCIUM 9.7 04/13/2018   ANIONGAP 8 06/07/2014   Lab Results  Component Value Date   CHOL 334 (H) 04/13/2018   Lab Results  Component Value Date   HDL 40 04/13/2018   Lab Results  Component Value Date   LDLCALC 232 (H) 04/13/2018  Lab Results  Component Value Date   TRIG 309 (H) 04/13/2018   Lab Results  Component Value Date   CHOLHDL 8.4 (H) 04/13/2018   No results found for: HGBA1C    Assessment & Plan:   Problem List Items Addressed This Visit      Cardiovascular and Mediastinum   Uncontrolled hypertension - Primary    Other Visit Diagnoses    Obstructive sleep apnea syndrome         Spent time discussing patient meds and how to take them He will continue his blood pressure meds He will need to return for 2 nurses visit.  He is encouraged to do exercise regularly  No orders of the defined types were placed in this encounter.   Follow-up: No follow-ups on file.    Forrest Moron, MD

## 2019-03-14 NOTE — Patient Instructions (Addendum)
1. Take metoprolol two times a day with meals 2. Continue your current medications 3. Check your blood pressures daily at different times of the day 4. Adding 10 minutes of exercise daily 5. Eat regular meals   If you have lab work done today you will be contacted with your lab results within the next 2 weeks.  If you have not heard from [place] then please contact us. The fastest way to get your results is to register for My Chart.   IF you received an x-ray today, you will receive an invoice from [facility]. Please contact [facility] at [phone] with questions or concerns regarding your invoice.   IF you received labwork today, you will receive an invoice from [facility]. Please contact [facility] at [phone] with questions or concerns regarding your invoice.   Our billing staff will not be able to assist you with questions regarding bills from these companies.  You will be contacted with the lab results as soon as they are available. The fastest way to get your results is to activate your My Chart account. Instructions are located on the last page of this paperwork. If you have not heard from [place] regarding the results in 2 weeks, please contact this office.      How to Take Your Blood Pressure You can take your blood pressure at home with a machine. You may need to check your blood pressure at home:  To check if you have high blood pressure (hypertension).  To check your blood pressure over time.  To make sure your blood pressure medicine is working. Supplies needed: You will need a blood pressure machine, or monitor. You can buy one at a drugstore or online. When choosing one:  Choose one with an arm cuff.  Choose one that wraps around your upper arm. Only one finger should fit between your arm and the cuff.  Do not choose one that measures your blood pressure from your wrist or finger. Your doctor can suggest a monitor. How to prepare Avoid these things  for 30 minutes before checking your blood pressure:  Drinking caffeine.  Drinking alcohol.  Eating.  Smoking.  Exercising. Five minutes before checking your blood pressure:  Pee.  Sit in a dining chair. Avoid sitting in a soft couch or armchair.  Be quiet. Do not talk. How to take your blood pressure Follow the instructions that came with your machine. If you have a digital blood pressure monitor, these may be the instructions: 1. Sit up straight. 2. Place your feet on the floor. Do not cross your ankles or legs. 3. Rest your left arm at the level of your heart. You may rest it on a table, desk, or chair. 4. Pull up your shirt sleeve. 5. Wrap the blood pressure cuff around the upper part of your left arm. The cuff should be 1 inch (2.5 cm) above your elbow. It is best to wrap the cuff around bare skin. 6. Fit the cuff snugly around your arm. You should be able to place only one finger between the cuff and your arm. 7. Put the cord inside the groove of your elbow. 8. Press the power button. 9. Sit quietly while the cuff fills with air and loses air. 10. Write down the numbers on the screen. 11. Wait 2-3 minutes and then repeat steps 1-10. What do the numbers mean? Two numbers make up your blood pressure. The first number is called systolic pressure. The second is called diastolic pressure.  An example of a blood pressure reading is "120 over 80" (or 120/80). If you are an adult and do not have a medical condition, use this guide to find out if your blood pressure is normal: Normal  First number: below 120.  Second number: below 80. Elevated  First number: 120-129.  Second number: below 80. Hypertension stage 1  First number: 130-139.  Second number: 80-89. Hypertension stage 2  First number: 140 or above.  Second number: 38 or above. Your blood pressure is above normal even if only the top or bottom number is above normal. Follow these instructions at  home:  Check your blood pressure as often as your doctor tells you to.  Take your monitor to your next doctor's appointment. Your doctor will: ? Make sure you are using it correctly. ? Make sure it is working right.  Make sure you understand what your blood pressure numbers should be.  Tell your doctor if your medicines are causing side effects. Contact a doctor if:  Your blood pressure keeps being high. Get help right away if:  Your first blood pressure number is higher than 180.  Your second blood pressure number is higher than 120. This information is not intended to replace advice given to you by your health care provider. Make sure you discuss any questions you have with your health care provider. Document Released: 03/20/2008 Document Revised: 03/20/2017 Document Reviewed: 09/14/2015 Elsevier Patient Education  2020 [employer].

## 2019-03-15 LAB — BASIC METABOLIC PANEL
BUN/Creatinine Ratio: 10 (ref 9–20)
BUN: 15 mg/dL (ref 6–24)
CO2: 24 mmol/L (ref 20–29)
Calcium: 10.4 mg/dL — ABNORMAL HIGH (ref 8.7–10.2)
Chloride: 105 mmol/L (ref 96–106)
Creatinine, Ser: 1.45 mg/dL — ABNORMAL HIGH (ref 0.76–1.27)
GFR calc Af Amer: 65 mL/min/{1.73_m2} (ref >59)
GFR calc non Af Amer: 56 mL/min/{1.73_m2} — ABNORMAL LOW (ref >59)
Glucose: 96 mg/dL (ref 65–99)
Potassium: 4.3 mmol/L (ref 3.5–5.2)
Sodium: 142 mmol/L (ref 134–144)

## 2019-03-15 LAB — LIPID PANEL
Chol/HDL Ratio: 5.5 ratio — ABNORMAL HIGH (ref 0.0–5.0)
Cholesterol, Total: 214 mg/dL — ABNORMAL HIGH (ref 100–199)
HDL: 39 mg/dL — ABNORMAL LOW (ref >39)
LDL Chol Calc (NIH): 111 mg/dL — ABNORMAL HIGH (ref 0–99)
Triglycerides: 373 mg/dL — ABNORMAL HIGH (ref 0–149)
VLDL Cholesterol Cal: 64 mg/dL — ABNORMAL HIGH (ref 5–40)

## 2019-03-16 ENCOUNTER — Other Ambulatory Visit: Payer: Self-pay

## 2019-03-16 ENCOUNTER — Other Ambulatory Visit: Payer: Self-pay | Admitting: Family Medicine

## 2019-03-16 DIAGNOSIS — Z760 Encounter for issue of repeat prescription: Secondary | ICD-10-CM

## 2019-03-16 MED ORDER — METOPROLOL TARTRATE 50 MG PO TABS: 50.0000 mg | ORAL_TABLET | Freq: Two times a day (BID) | ORAL | 0 refills | Status: DC

## 2019-03-16 NOTE — Telephone Encounter (Signed)
Sent to different pharmacy

## 2019-03-25 ENCOUNTER — Ambulatory Visit (INDEPENDENT_AMBULATORY_CARE_PROVIDER_SITE_OTHER): Payer: Self-pay | Admitting: Family Medicine

## 2019-03-25 ENCOUNTER — Other Ambulatory Visit: Payer: Self-pay

## 2019-03-25 VITALS — BP 160/100

## 2019-03-25 DIAGNOSIS — I10 Essential (primary) hypertension: Secondary | ICD-10-CM

## 2019-04-01 ENCOUNTER — Ambulatory Visit: Payer: Self-pay

## 2019-04-05 ENCOUNTER — Ambulatory Visit: Payer: Self-pay

## 2019-04-27 ENCOUNTER — Encounter: Payer: Self-pay | Admitting: Family Medicine

## 2019-04-27 ENCOUNTER — Ambulatory Visit (INDEPENDENT_AMBULATORY_CARE_PROVIDER_SITE_OTHER): Payer: Self-pay | Admitting: Family Medicine

## 2019-04-27 ENCOUNTER — Other Ambulatory Visit: Payer: Self-pay

## 2019-04-27 VITALS — BP 150/96 | HR 75 | Temp 98.3°F | Resp 17 | Ht 72.0 in | Wt 237.4 lb

## 2019-04-27 DIAGNOSIS — N182 Chronic kidney disease, stage 2 (mild): Secondary | ICD-10-CM

## 2019-04-27 DIAGNOSIS — N2889 Other specified disorders of kidney and ureter: Secondary | ICD-10-CM

## 2019-04-27 DIAGNOSIS — Z5181 Encounter for therapeutic drug level monitoring: Secondary | ICD-10-CM

## 2019-04-27 DIAGNOSIS — I10 Essential (primary) hypertension: Secondary | ICD-10-CM

## 2019-04-27 NOTE — Patient Instructions (Addendum)
If you have lab work done today you will be contacted with your lab results within the next 2 weeks.  If you have not heard from [place] then please contact us. The fastest way to get your results is to register for My Chart.   IF you received an x-ray today, you will receive an invoice from [facility]. Please contact [facility] at [phone] with questions or concerns regarding your invoice.   IF you received labwork today, you will receive an invoice from [place]. Please contact [facility] at [phone] with questions or concerns regarding your invoice.   Our billing staff will not be able to assist you with questions regarding bills from these companies.  You will be contacted with the lab results as soon as they are available. The fastest way to get your results is to activate your My Chart account. Instructions are located on the last page of this paperwork. If you have not heard from [place] regarding the results in 2 weeks, please contact this office.     Aldosterone Test Why am I having this test? The aldosterone test is used to check for overactive or underactive adrenal glands, both of which can be caused by various conditions. What is being tested? This test measures your aldosterone levels to see if they are abnormal. Aldosterone is a steroid that is produced by the two adrenal glands, which are located at the top of each kidney. Aldosterone directly controls the amount of salt (sodium) that is held by the kidneys and indirectly controls the excretion of potassium. It plays an important role in the control of blood volume and blood pressure. Overactive adrenal glands will produce too much aldosterone. Underactive adrenal glands will produce too little aldosterone. What kind of sample is taken?  A blood sample is required for this test. It is usually collected by inserting a needle into a blood vessel. A 24-hour urine sample may also be tested. How do I collect  samples at home?  You may be asked to collect urine samples at home over a certain period of time. Follow instructions from a health care provider about how to collect the samples. When collecting a urine sample at home, make sure you:  Use supplies and instructions that you received from the lab.  Collect urine only in the germ-free (sterile) cup that you received from the lab.  Do not let any toilet paper or stool (feces) get into the cup.  Refrigerate the sample until you can return it to the lab.  Return the samples to the lab as instructed. How do I prepare for this test? You may be asked to arrive well before your testing time so that you can remain in a lying or upright position long enough to establish that as your baseline testing position. Also, there are factors that can affect the results of the test. To get the most accurate results:  Avoid licorice for at least 2 weeks before the test. Licorice can affect your aldosterone levels.  Try to reduce stress.  Avoid activities that take a lot of physical effort. Tell a health care provider about:  All medicines you are taking, including vitamins, herbs, eye drops, creams, and over-the-counter medicines.  Any current or recent illness. How are the results reported? Your test results will be reported as values. Your health care provider will compare your results to normal ranges that were established after testing a large group of people (reference ranges). Reference ranges may vary  among labs and hospitals. For this test, common reference ranges are: Blood  Lying face up (supine): 3-10 ng/dL or 1.61-0.96 nmol/L (SI units).  Upright (sitting for at least 2 hours): ? Adult male: 5-30 ng/dL or 0.45-4.09 nmol/L (SI units). ? Adult male: 6-22 ng/dL or 8.11-9.14 nmol/L (SI units). ? Newborn: 5-60 ng/dL. ? Child 40 week to 77 year old: 1-160 ng/dL. ? Child 72-37 years old: 5-60 ng/dL. ? Child 26-28 years old: 5-80 ng/dL. ? Child  37-66 years old: 5-50 ng/dL. ? Child 31-54 years old: 5-70 ng/dL. ? Child or adolescent 78-27 years old: 5-50 ng/dL. Urine Adults: 2-26 mcg in 24 hours or 6-72 nmol in 24 hours (SI units). What do the results mean? Results within the reference ranges are considered normal. Increased aldosterone levels may indicate:  Aldosterone-producing adrenal tumor.  Low sodium.  High potassium levels.  Laxative abuse.  Use of diuretics.  Pregnancy.  Renal artery stenosis.  Cushing disease.  Stress. Decreased aldosterone levels may indicate:  Aldosterone deficiency.  Steroid therapy.  Addison disease.  High sodium levels.  Toxemia of pregnancy.  Use of certain blood pressure medicines. Talk with your health care provider about what your results mean. Questions to ask your health care provider Ask your health care provider, or the department that is doing the test:  When will my results be ready?  How will I get my results?  What are my treatment options?  What other tests do I need?  What are my next steps? Summary  The aldosterone test is used to check for overactive or underactive adrenal glands, both of which can be caused by various conditions.  A blood sample is required for this test. A 24-hour urine sample may also be tested.  You may be asked to arrive well before your testing time so that you can remain in a lying or upright position long enough to establish that as your baseline testing position.  Talk with your health care provider about what your results mean. This information is not intended to replace advice given to you by your health care provider. Make sure you discuss any questions you have with your health care provider. Document Revised: 03/20/2017 Document Reviewed: 11/24/2016 Elsevier Patient Education  2020 [employer].

## 2019-04-27 NOTE — Progress Notes (Signed)
Established Patient Office Visit  Subjective:  Patient ID: Cameron Wells, male    DOB: 20-Aug-1970  Age: 49 y.o. MRN: [id number]  CC:  Chief Complaint  Patient presents with  . Hypertension    6 week recheck    HPI [place] presents for   Patient reports that he is taking the metoprolol 66m bid He reports that he is also taking the losartan-hctz 100-12.562mNo headaches, no dizziness currently  BP Readings from Last 3 Encounters:  04/27/19 (!) 150/96  03/25/19 (!) 160/100  03/14/19 (!) 172/110   He has a bp monitor at home but is not using it as yet.   Lab Results  Component Value Date   CREATININE 1.45 (H) 03/14/2019       Past Medical History:  Diagnosis Date  . Allergy   . Anxiety   . Asthma   . Hypertension     No past surgical history on file.  Family History  Problem Relation Age of Onset  . CAD Maternal Grandmother   . Cancer Paternal Grandfather   . Hypertension Maternal Aunt     Social History   Socioeconomic History  . Marital status: Married    Spouse name: Not on file  . Number of children: Not on file  . Years of education: Not on file  . Highest education level: Not on file  Occupational History  . Not on file  Tobacco Use  . Smoking status: Former SmResearch scientist (life sciences). Smokeless tobacco: Never Used  Substance and Sexual Activity  . Alcohol use: Yes    Alcohol/week: 0.0 standard drinks  . Drug use: No  . Sexual activity: Not on file  Other Topics Concern  . Not on file  Social History Narrative  . Not on file   Social Determinants of Health   Financial Resource Strain:   . Difficulty of Paying Living Expenses: Not on file  Food Insecurity:   . Worried About RuCharity fundraisern the Last Year: Not on file  . Ran Out of Food in the Last Year: Not on file  Transportation Needs:   . Lack of Transportation (Medical): Not on file  . Lack of Transportation (Non-Medical): Not on file  Physical Activity:   . Days of Exercise per Week: Not  on file  . Minutes of Exercise per Session: Not on file  Stress:   . Feeling of Stress : Not on file  Social Connections:   . Frequency of Communication with Friends and Family: Not on file  . Frequency of Social Gatherings with Friends and Family: Not on file  . Attends Religious Services: Not on file  . Active Member of Clubs or Organizations: Not on file  . Attends ClArchivisteetings: Not on file  . Marital Status: Not on file  Intimate Partner Violence:   . Fear of Current or Ex-Partner: Not on file  . Emotionally Abused: Not on file  . Physically Abused: Not on file  . Sexually Abused: Not on file    Outpatient Medications Prior to Visit  Medication Sig Dispense Refill  . cetirizine (ZYRTEC) 10 MG tablet Take 1 tablet (10 mg total) by mouth daily. 30 tablet 11  . FLUoxetine (PROZAC) 40 MG capsule Take 1 capsule (40 mg total) by mouth daily. 90 capsule 3  . fluticasone (FLONASE) 50 MCG/ACT nasal spray Place 2 sprays into both nostrils daily. 16 g 6  . losartan-hydrochlorothiazide (HYZAAR) 100-12.5 MG tablet Take 1 tablet by  mouth daily. 90 tablet 3  . metoprolol tartrate (LOPRESSOR) 50 MG tablet Take 1 tablet (50 mg total) by mouth 2 (two) times daily. 180 tablet 0  . omeprazole (PRILOSEC OTC) 20 MG tablet Take 20 mg by mouth daily.    . rosuvastatin (CRESTOR) 5 MG tablet Take 1 tablet (5 mg total) by mouth daily. 90 tablet 3   No facility-administered medications prior to visit.    Allergies  Allergen Reactions  . Penicillins Anaphylaxis    ROS Review of Systems Review of Systems  Constitutional: Negative for activity change, appetite change, chills and fever.  HENT: Negative for congestion, nosebleeds, trouble swallowing and voice change.   Respiratory: Negative for cough, shortness of breath and wheezing.   Gastrointestinal: Negative for diarrhea, nausea and vomiting.  Genitourinary: Negative for difficulty urinating, dysuria, flank pain and hematuria.    Musculoskeletal: Negative for back pain, joint swelling and neck pain.  Neurological: Negative for dizziness, speech difficulty, light-headedness and numbness.  See HPI. All other review of systems negative.     Objective:    Physical Exam  BP (!) 150/96 (BP Location: Left Arm, Patient Position: Sitting, Cuff Size: Large)   Pulse 75   Temp 98.3 F (36.8 C) (Oral)   Resp 17   Ht 6' (1.829 m)   Wt 237 lb 6.4 oz (107.7 kg)   SpO2 99%   BMI 32.20 kg/m  Wt Readings from Last 3 Encounters:  04/27/19 237 lb 6.4 oz (107.7 kg)  03/14/19 236 lb 9.6 oz (107.3 kg)  12/19/18 230 lb (104.3 kg)    Physical Exam  Constitutional: Oriented to person, place, and time. Appears well-developed and well-nourished.  HENT:  Head: Normocephalic and atraumatic.  Eyes: Conjunctivae and EOM are normal.  Cardiovascular: Normal rate, regular rhythm, normal heart sounds and intact distal pulses.  No murmur heard. Pulmonary/Chest: Effort normal and breath sounds normal. No stridor. No respiratory distress. Has no wheezes.  Neurological: Is alert and oriented to person, place, and time.  Skin: Skin is warm. Capillary refill takes less than 2 seconds.  Psychiatric: Has a normal mood and affect. Behavior is normal. Judgment and thought content normal.   Health Maintenance Due  Topic Date Due  . HIV Screening  09/07/1984  . TETANUS/TDAP  09/07/1988  . INFLUENZA VACCINE  11/20/2018    There are no preventive care reminders to display for this patient.  Lab Results  Component Value Date   TSH 1.150 04/13/2018   Lab Results  Component Value Date   WBC 8.9 06/07/2014   HGB 14.9 06/07/2014   HCT 43.0 06/07/2014   MCV 89.2 06/07/2014   PLT 187 06/07/2014   Lab Results  Component Value Date   NA 142 03/14/2019   K 4.3 03/14/2019   CO2 24 03/14/2019   GLUCOSE 96 03/14/2019   BUN 15 03/14/2019   CREATININE 1.45 (H) 03/14/2019   BILITOT 0.4 04/13/2018   ALKPHOS 66 04/13/2018   AST 27  04/13/2018   ALT 34 04/13/2018   PROT 7.4 04/13/2018   ALBUMIN 4.7 04/13/2018   CALCIUM 10.4 (H) 03/14/2019   ANIONGAP 8 06/07/2014   Lab Results  Component Value Date   CHOL 214 (H) 03/14/2019   Lab Results  Component Value Date   HDL 39 (L) 03/14/2019   Lab Results  Component Value Date   LDLCALC 111 (H) 03/14/2019   Lab Results  Component Value Date   TRIG 373 (H) 03/14/2019   Lab Results  Component  Value Date   CHOLHDL 5.5 (H) 03/14/2019   No results found for: HGBA1C    Assessment & Plan:   Problem List Items Addressed This Visit      Cardiovascular and Mediastinum   Uncontrolled hypertension - Primary   Relevant Orders   Aldosterone + renin activity w/ ratio   CMP14+EGFR    Other Visit Diagnoses    Encounter for medication monitoring       Chronic renal insufficiency, stage 2 (mild)         Uncontrolled hypertension Will check renin aldosterone levels in this patient who has had a bp that has been very elevated and difficult to control It seems like his increased compliance with meds is improving his symptoms Will remain at this dose for now but if his bp plateau's would recommend increasing the hctz by another 12.67m  Continue current meds for now  Chronic renal insufficiency Due to uncontrolled bp Discussed the importance of proactive protecting the kidneys If his renal function declines further and his GFR decreases to less than 50 will send pt to Nephrology  No orders of the defined types were placed in this encounter.   Follow-up: No follow-ups on file.   A total of 25 minutes were spent face-to-face with the patient during this encounter and over half of that time was spent on counseling and coordination of care.  ZForrest Moron MD

## 2019-06-01 ENCOUNTER — Ambulatory Visit: Payer: Self-pay | Attending: Internal Medicine

## 2019-06-01 ENCOUNTER — Other Ambulatory Visit: Payer: Self-pay | Admitting: Family Medicine

## 2019-06-01 DIAGNOSIS — Z20822 Contact with and (suspected) exposure to covid-19: Secondary | ICD-10-CM | POA: Insufficient documentation

## 2019-06-01 DIAGNOSIS — I10 Essential (primary) hypertension: Secondary | ICD-10-CM

## 2019-06-02 LAB — NOVEL CORONAVIRUS, NAA: SARS-CoV-2, NAA: NOT DETECTED

## 2019-06-06 ENCOUNTER — Telehealth: Payer: Self-pay | Admitting: General Practice

## 2019-06-06 NOTE — Telephone Encounter (Signed)
Negative COVID results given. Patient results "NOT Detected." Caller expressed understanding. ° °

## 2019-06-08 ENCOUNTER — Ambulatory Visit: Payer: Self-pay | Admitting: Family Medicine

## 2019-07-06 LAB — CMP14+EGFR
ALT: 37 IU/L (ref 0–44)
AST: 29 IU/L (ref 0–40)
Albumin/Globulin Ratio: 1.6 (ref 1.2–2.2)
Albumin: 4.6 g/dL (ref 4.0–5.0)
Alkaline Phosphatase: 61 IU/L (ref 39–117)
BUN/Creatinine Ratio: 13 (ref 9–20)
BUN: 15 mg/dL (ref 6–24)
Bilirubin Total: 0.4 mg/dL (ref 0.0–1.2)
CO2: 24 mmol/L (ref 20–29)
Calcium: 9.5 mg/dL (ref 8.7–10.2)
Chloride: 103 mmol/L (ref 96–106)
Creatinine, Ser: 1.12 mg/dL (ref 0.76–1.27)
GFR calc Af Amer: 89 mL/min/{1.73_m2} (ref >59)
GFR calc non Af Amer: 77 mL/min/{1.73_m2} (ref >59)
Globulin, Total: 2.9 g/dL (ref 1.5–4.5)
Glucose: 214 mg/dL — ABNORMAL HIGH (ref 65–99)
Potassium: 4.4 mmol/L (ref 3.5–5.2)
Sodium: 141 mmol/L (ref 134–144)
Total Protein: 7.5 g/dL (ref 6.0–8.5)

## 2019-07-06 LAB — ALDOSTERONE + RENIN ACTIVITY W/ RATIO
ALDOS/RENIN RATIO: 97 — ABNORMAL HIGH (ref 0.0–30.0)
ALDOSTERONE: 22.9 ng/dL (ref 0.0–30.0)
Renin: 0.236 ng/mL/hr (ref 0.167–5.380)

## 2019-07-10 ENCOUNTER — Other Ambulatory Visit: Payer: Self-pay | Admitting: Family Medicine

## 2019-07-10 DIAGNOSIS — I10 Essential (primary) hypertension: Secondary | ICD-10-CM

## 2019-07-10 NOTE — Telephone Encounter (Signed)
Approved for refill per protocol.  Requested Prescriptions  Pending Prescriptions Disp Refills  . losartan-hydrochlorothiazide (HYZAAR) 100-12.5 MG tablet [Pharmacy Med Name: Losartan Potassium-HCTZ 100-12.5 MG Oral Tablet] 30 tablet 0    Sig: Take 1 tablet by mouth once daily     Cardiovascular: ARB + Diuretic Combos Failed - 07/10/2019  2:37 PM      Failed - Last BP in normal range    BP Readings from Last 1 Encounters:  04/27/19 (!) 150/96         Passed - K in normal range and within 180 days    Potassium  Date Value Ref Range Status  04/27/2019 4.4 3.5 - 5.2 mmol/L Final         Passed - Na in normal range and within 180 days    Sodium  Date Value Ref Range Status  04/27/2019 141 134 - 144 mmol/L Final         Passed - Cr in normal range and within 180 days    Creat  Date Value Ref Range Status  10/30/2014 1.21 0.50 - 1.35 mg/dL Final   Creatinine, Ser  Date Value Ref Range Status  04/27/2019 1.12 0.76 - 1.27 mg/dL Final         Passed - Ca in normal range and within 180 days    Calcium  Date Value Ref Range Status  04/27/2019 9.5 8.7 - 10.2 mg/dL Final         Passed - Patient is not pregnant      Passed - Valid encounter within last 6 months    Recent Outpatient Visits          2 months ago Uncontrolled hypertension   [facility] at [facility], Manus Rudd, MD   3 months ago Uncontrolled hypertension   [facility] at [facility], Meda Coffee, MD   3 months ago Uncontrolled hypertension   Primary Care at [facility], Manus Rudd, MD   8 months ago Uncontrolled hypertension   Primary Care at [facility], Manus Rudd, MD   1 year ago Uncontrolled hypertension   Primary Care at [facility], Manus Rudd, MD

## 2019-07-29 ENCOUNTER — Other Ambulatory Visit: Payer: Self-pay | Admitting: Family Medicine

## 2019-07-29 DIAGNOSIS — Z889 Allergy status to unspecified drugs, medicaments and biological substances status: Secondary | ICD-10-CM

## 2019-08-18 NOTE — Telephone Encounter (Signed)
No action needed

## 2019-08-19 ENCOUNTER — Other Ambulatory Visit: Payer: Self-pay | Admitting: Family Medicine

## 2019-08-19 DIAGNOSIS — I10 Essential (primary) hypertension: Secondary | ICD-10-CM

## 2019-09-19 ENCOUNTER — Other Ambulatory Visit: Payer: Self-pay | Admitting: Family Medicine

## 2019-09-19 DIAGNOSIS — I10 Essential (primary) hypertension: Secondary | ICD-10-CM

## 2019-10-27 ENCOUNTER — Other Ambulatory Visit: Payer: Self-pay | Admitting: Family Medicine

## 2019-10-27 DIAGNOSIS — I10 Essential (primary) hypertension: Secondary | ICD-10-CM

## 2019-10-27 NOTE — Telephone Encounter (Signed)
Requested  medications are  due for refill today yes  Requested medications are on the active medication list yes  Last refill 6/1  Future visit scheduled NO  Notes to clinic already had curtesy refill, no appt scheduled.

## 2019-10-28 NOTE — Telephone Encounter (Signed)
10/28/2019 - PATIENT REQUESTING A REFILL ON HIS LOSARTAN POTASSIUM-HCTZ 100-12.5 MG. HE IS A FORMER PATIENT OF DR. STALLINGS. I HAVE SCHEDULED HIM A TRANSFER OF CARE APPOINTMENT ON Friday 12/16/2019 AT 12:50 pm WITH RICH MORROW. I WILL ROUTE THIS MESSAGE BACK TO THE CLINICAL TEAM SO HE CAN GET A COURTESY REFILL UNTIL HIS APPOINTMENT. MBC

## 2019-10-28 NOTE — Telephone Encounter (Signed)
Please schedule pt for a f/u appointment or a TOC since he used to see stalling. Confirm that he was to continue care before we can send additional refills.

## 2019-11-02 ENCOUNTER — Other Ambulatory Visit: Payer: Self-pay

## 2019-11-02 DIAGNOSIS — Z889 Allergy status to unspecified drugs, medicaments and biological substances status: Secondary | ICD-10-CM

## 2019-11-02 MED ORDER — FLUTICASONE PROPIONATE 50 MCG/ACT NA SUSP: NASAL | 0 refills | Status: DC

## 2019-12-16 ENCOUNTER — Ambulatory Visit (INDEPENDENT_AMBULATORY_CARE_PROVIDER_SITE_OTHER): Payer: Self-pay | Admitting: Registered Nurse

## 2019-12-16 ENCOUNTER — Encounter: Payer: Self-pay | Admitting: Registered Nurse

## 2019-12-16 ENCOUNTER — Other Ambulatory Visit: Payer: Self-pay

## 2019-12-16 VITALS — BP 178/111 | HR 76 | Temp 97.7°F | Resp 18 | Ht 72.0 in | Wt 233.0 lb

## 2019-12-16 DIAGNOSIS — Z760 Encounter for issue of repeat prescription: Secondary | ICD-10-CM

## 2019-12-16 DIAGNOSIS — E785 Hyperlipidemia, unspecified: Secondary | ICD-10-CM

## 2019-12-16 DIAGNOSIS — F4323 Adjustment disorder with mixed anxiety and depressed mood: Secondary | ICD-10-CM

## 2019-12-16 DIAGNOSIS — I10 Essential (primary) hypertension: Secondary | ICD-10-CM

## 2019-12-16 MED ORDER — FLUOXETINE HCL 40 MG PO CAPS: 40.0000 mg | ORAL_CAPSULE | Freq: Every day | ORAL | 3 refills | Status: DC

## 2019-12-16 MED ORDER — METOPROLOL TARTRATE 50 MG PO TABS: 50.0000 mg | ORAL_TABLET | Freq: Two times a day (BID) | ORAL | 0 refills | Status: DC

## 2019-12-16 MED ORDER — ROSUVASTATIN CALCIUM 5 MG PO TABS: 5.0000 mg | ORAL_TABLET | Freq: Every day | ORAL | 3 refills | Status: DC

## 2019-12-16 MED ORDER — LOSARTAN POTASSIUM-HCTZ 100-12.5 MG PO TABS: 1.0000 | ORAL_TABLET | Freq: Every day | ORAL | 0 refills | Status: DC

## 2019-12-16 NOTE — Progress Notes (Signed)
Established Patient Office Visit  Subjective:  Patient ID: Cameron Wells, male    DOB: [birth year]  Age: 49 y.o. MRN: [id number]  CC:  Chief Complaint  Patient presents with  . Transitions Of Care    patient states he is here for an Toc and also a medication refill on medications    HPI Cameron Wells presents for toc from [place]  Hx of anxiety, allergies, htn, and hld. Had hx of kidney dysfunction r/t unctontrolled HTN  Anxiety: taking fluoxetine 40mg  PO qd. Has been on this dose for years. Feels that it may have good effect, but is not able to discern how much it is helping. Expresses interest in coming off of this medication in the future.   Allergies: taking OTCs prn. Controlled well with this. No new concerns or complaints.  HTN: Taking losartan-hctz 100-12.5mg  PO qd and metoprolol 50mg  PO bid. Reports no AEs. Tolerates well. No CV symptoms today despite elevated BP. Reports good compliance but has not yet taken meds today.  HLD: lifestyle modifications and rosuvatatin 5mg  PO qd. No AEs. No symptoms of concern. Admits lifestyle modifications suboptimal.   No other concerns or complaints, feeling well.   Past Medical History:  Diagnosis Date  . Allergy   . Anxiety   . Asthma   . Hypertension     History reviewed. No pertinent surgical history.  Family History  Problem Relation Age of Onset  . CAD Maternal Grandmother   . Cancer Paternal Grandfather   . Hypertension Maternal Aunt     Social History   Socioeconomic History  . Marital status: Married    Spouse name: Not on file  . Number of children: Not on file  . Years of education: Not on file  . Highest education level: Not on file  Occupational History  . Not on file  Tobacco Use  . Smoking status: Former  . Smokeless tobacco: Never Used  Vaping Use  . Vaping Use: Never used  Substance and Sexual Activity  . Alcohol use: Yes    Alcohol/week: 0.0 standard drinks  . Drug use: No  . Sexual activity:  Not on file  Other Topics Concern  . Not on file  Social History Narrative  . Not on file   Social Determinants of Health   Financial Resource Strain:   . Difficulty of Paying Living Expenses: Not on file  Food Insecurity:   . Worried About in the Last Year: Not on file  . Ran Out of Food in the Last Year: Not on file  Transportation Needs:   . Lack of Transportation (Medical): Not on file  . Lack of Transportation (Non-Medical): Not on file  Physical Activity:   . Days of Exercise per Week: Not on file  . Minutes of Exercise per Session: Not on file  Stress:   . Feeling of Stress : Not on file  Social Connections:   . Frequency of Communication with Friends and Family: Not on file  . Frequency of Social Gatherings with Friends and Family: Not on file  . Attends Religious Services: Not on file  . Active Member of Clubs or Organizations: Not on file  . Attends Meetings: Not on file  . Marital Status: Not on file  Intimate Partner Violence:   . Fear of Current or Ex-Partner: Not on file  . Emotionally Abused: Not on file  . Physically Abused: Not on file  . Sexually Abused: Not  on file    Outpatient Medications Prior to Visit  Medication Sig Dispense Refill  . cetirizine (ZYRTEC) 10 MG tablet Take 1 tablet by mouth once daily 90 tablet 1  . FLUoxetine (PROZAC) 40 MG capsule Take 1 capsule (40 mg total) by mouth daily. 90 capsule 3  . fluticasone (FLONASE) 50 MCG/ACT nasal spray Use 2 spray(s) in each nostril once daily 16 g 0  . losartan-hydrochlorothiazide (HYZAAR) 100-12.5 MG tablet Take 1 tablet by mouth once daily 30 tablet 0  . metoprolol tartrate (LOPRESSOR) 50 MG tablet Take 1 tablet (50 mg total) by mouth 2 (two) times daily. 180 tablet 0  . omeprazole (PRILOSEC OTC) 20 MG tablet Take 20 mg by mouth daily.    . rosuvastatin (CRESTOR) 5 MG tablet Take 1 tablet (5 mg total) by mouth daily. 90 tablet 3   No facility-administered  medications prior to visit.    Allergies  Allergen Reactions  . Penicillins Anaphylaxis    ROS Review of Systems  Constitutional: Negative.   HENT: Negative.   Eyes: Negative.   Respiratory: Negative.   Cardiovascular: Negative.   Gastrointestinal: Negative.   Endocrine: Negative.   Genitourinary: Negative.   Musculoskeletal: Negative.   Skin: Negative.   Allergic/Immunologic: Negative.   Neurological: Negative.   Hematological: Negative.   Psychiatric/Behavioral: Negative.   All other systems reviewed and are negative.     Objective:    Physical Exam Vitals and nursing note reviewed.  Constitutional:      General: He is not in acute distress.    Appearance: Normal appearance. He is obese. He is not ill-appearing, toxic-appearing or diaphoretic.  Cardiovascular:     Rate and Rhythm: Normal rate and regular rhythm.     Heart sounds: Normal heart sounds. No murmur heard.  No friction rub. No gallop.   Pulmonary:     Effort: Pulmonary effort is normal. No respiratory distress.     Breath sounds: Normal breath sounds. No stridor. No wheezing, rhonchi or rales.  Chest:     Chest wall: No tenderness.  Skin:    General: Skin is warm and dry.     Capillary Refill: Capillary refill takes less than 2 seconds.  Neurological:     General: No focal deficit present.     Mental Status: He is alert and oriented to person, place, and time. Mental status is at baseline.  Psychiatric:        Mood and Affect: Mood normal.        Behavior: Behavior normal.        Thought Content: Thought content normal.        Judgment: Judgment normal.     BP (!) 178/111   Pulse 76   Temp 97.7 F (36.5 C) (Temporal)   Resp 18   Ht 6' (1.829 m)   Wt 233 lb (105.7 kg)   SpO2 97%   BMI 31.60 kg/m  Wt Readings from Last 3 Encounters:  12/16/19 233 lb (105.7 kg)  04/27/19 237 lb 6.4 oz (107.7 kg)  03/14/19 236 lb 9.6 oz (107.3 kg)     There are no preventive care reminders to display  for this patient.  There are no preventive care reminders to display for this patient.  Lab Results  Component Value Date   TSH 1.150 04/13/2018   Lab Results  Component Value Date   WBC 8.9 06/07/2014   HGB 14.9 06/07/2014   HCT 43.0 06/07/2014   MCV 89.2 06/07/2014  PLT 187 06/07/2014   Lab Results  Component Value Date   NA 141 04/27/2019   K 4.4 04/27/2019   CO2 24 04/27/2019   GLUCOSE 214 (H) 04/27/2019   BUN 15 04/27/2019   CREATININE 1.12 04/27/2019   BILITOT 0.4 04/27/2019   ALKPHOS 61 04/27/2019   AST 29 04/27/2019   ALT 37 04/27/2019   PROT 7.5 04/27/2019   ALBUMIN 4.6 04/27/2019   CALCIUM 9.5 04/27/2019   ANIONGAP 8 06/07/2014   Lab Results  Component Value Date   CHOL 214 (H) 03/14/2019   Lab Results  Component Value Date   HDL 39 (L) 03/14/2019   Lab Results  Component Value Date   LDLCALC 111 (H) 03/14/2019   Lab Results  Component Value Date   TRIG 373 (H) 03/14/2019   Lab Results  Component Value Date   CHOLHDL 5.5 (H) 03/14/2019   No results found for: HGBA1C    Assessment & Plan:   Problem List Items Addressed This Visit      Cardiovascular and Mediastinum   Uncontrolled hypertension - Primary   Relevant Orders   Comprehensive metabolic panel   CBC With Differential   TSH   Hemoglobin A1c     Other   Dyslipidemia   Relevant Orders   Lipid panel      No orders of the defined types were placed in this encounter.   Follow-up: No follow-ups on file.   PLAN  Continue meds as prescribed  Labs collected, will follow up as warranted  Return in 2 weeks for nurse visit bp check  Return in 3 mo for med check and repeat labs if warranted  Patient encouraged to call clinic with any questions, comments, or concerns.  Janeece Agee, NP

## 2019-12-16 NOTE — Patient Instructions (Signed)
° ° ° °  If you have lab work done today you will be contacted with your lab results within the next 2 weeks.  If you have not heard from us then please contact us. The fastest way to get your results is to register for My Chart. ° ° °IF you received an x-ray today, you will receive an invoice from [facility]. Please contact [facility] at [phone] with questions or concerns regarding your invoice.  ° °IF you received labwork today, you will receive an invoice from [facility]. Please contact [facility] at [phone] with questions or concerns regarding your invoice.  ° °Our billing staff will not be able to assist you with questions regarding bills from these companies. ° °You will be contacted with the lab results as soon as they are available. The fastest way to get your results is to activate your My Chart account. Instructions are located on the last page of this paperwork. If you have not heard from us regarding the results in 2 weeks, please contact this office. °  ° ° ° °

## 2019-12-17 LAB — COMPREHENSIVE METABOLIC PANEL
ALT: 28 IU/L (ref 0–44)
AST: 22 IU/L (ref 0–40)
Albumin/Globulin Ratio: 1.8 (ref 1.2–2.2)
Albumin: 4.8 g/dL (ref 4.0–5.0)
Alkaline Phosphatase: 61 IU/L (ref 48–121)
BUN/Creatinine Ratio: 12 (ref 9–20)
BUN: 15 mg/dL (ref 6–24)
Bilirubin Total: 0.5 mg/dL (ref 0.0–1.2)
CO2: 23 mmol/L (ref 20–29)
Calcium: 9.6 mg/dL (ref 8.7–10.2)
Chloride: 107 mmol/L — ABNORMAL HIGH (ref 96–106)
Creatinine, Ser: 1.27 mg/dL (ref 0.76–1.27)
GFR calc Af Amer: 76 mL/min/{1.73_m2} (ref >59)
GFR calc non Af Amer: 65 mL/min/{1.73_m2} (ref >59)
Globulin, Total: 2.6 g/dL (ref 1.5–4.5)
Glucose: 131 mg/dL — ABNORMAL HIGH (ref 65–99)
Potassium: 4.3 mmol/L (ref 3.5–5.2)
Sodium: 144 mmol/L (ref 134–144)
Total Protein: 7.4 g/dL (ref 6.0–8.5)

## 2019-12-17 LAB — CBC WITH DIFFERENTIAL
Basophils Absolute: 0 10*3/uL (ref 0.0–0.2)
Basos: 1 %
EOS (ABSOLUTE): 0.2 10*3/uL (ref 0.0–0.4)
Eos: 4 %
Hematocrit: 42.1 % (ref 37.5–51.0)
Hemoglobin: 13.7 g/dL (ref 13.0–17.7)
Immature Grans (Abs): 0 10*3/uL (ref 0.0–0.1)
Immature Granulocytes: 0 %
Lymphocytes Absolute: 2.3 10*3/uL (ref 0.7–3.1)
Lymphs: 35 %
MCH: 29.5 pg (ref 26.6–33.0)
MCHC: 32.5 g/dL (ref 31.5–35.7)
MCV: 91 fL (ref 79–97)
Monocytes Absolute: 0.5 10*3/uL (ref 0.1–0.9)
Monocytes: 7 %
Neutrophils Absolute: 3.5 10*3/uL (ref 1.4–7.0)
Neutrophils: 53 %
RBC: 4.64 x10E6/uL (ref 4.14–5.80)
RDW: 13.2 % (ref 11.6–15.4)
WBC: 6.6 10*3/uL (ref 3.4–10.8)

## 2019-12-17 LAB — LIPID PANEL
Chol/HDL Ratio: 7 ratio — ABNORMAL HIGH (ref 0.0–5.0)
Cholesterol, Total: 239 mg/dL — ABNORMAL HIGH (ref 100–199)
HDL: 34 mg/dL — ABNORMAL LOW (ref >39)
LDL Chol Calc (NIH): 130 mg/dL — ABNORMAL HIGH (ref 0–99)
Triglycerides: 415 mg/dL — ABNORMAL HIGH (ref 0–149)
VLDL Cholesterol Cal: 75 mg/dL — ABNORMAL HIGH (ref 5–40)

## 2019-12-17 LAB — HEMOGLOBIN A1C
Est. average glucose Bld gHb Est-mCnc: 171 mg/dL
Hgb A1c MFr Bld: 7.6 % — ABNORMAL HIGH (ref 4.8–5.6)

## 2019-12-17 LAB — TSH: TSH: 0.664 u[IU]/mL (ref 0.450–4.500)

## 2020-01-03 ENCOUNTER — Other Ambulatory Visit: Payer: Self-pay | Admitting: Registered Nurse

## 2020-01-03 DIAGNOSIS — E119 Type 2 diabetes mellitus without complications: Secondary | ICD-10-CM

## 2020-01-03 DIAGNOSIS — Z794 Long term (current) use of insulin: Secondary | ICD-10-CM | POA: Insufficient documentation

## 2020-01-03 MED ORDER — METFORMIN HCL 500 MG PO TABS: 500.0000 mg | ORAL_TABLET | Freq: Two times a day (BID) | ORAL | 3 refills | Status: DC

## 2020-04-30 ENCOUNTER — Other Ambulatory Visit: Payer: Self-pay | Admitting: Registered Nurse

## 2020-04-30 DIAGNOSIS — I10 Essential (primary) hypertension: Secondary | ICD-10-CM

## 2020-07-03 ENCOUNTER — Other Ambulatory Visit: Payer: Self-pay | Admitting: Registered Nurse

## 2020-07-03 DIAGNOSIS — Z889 Allergy status to unspecified drugs, medicaments and biological substances status: Secondary | ICD-10-CM

## 2020-07-03 DIAGNOSIS — Z760 Encounter for issue of repeat prescription: Secondary | ICD-10-CM

## 2020-07-03 MED ORDER — FLUTICASONE PROPIONATE 50 MCG/ACT NA SUSP: NASAL | 1 refills | Status: DC

## 2020-07-03 MED ORDER — CETIRIZINE HCL 10 MG PO TABS: 10.0000 mg | ORAL_TABLET | Freq: Every day | ORAL | 1 refills | Status: DC

## 2020-07-03 NOTE — Telephone Encounter (Signed)
Requested Prescriptions  Pending Prescriptions Disp Refills  . cetirizine (ZYRTEC) 10 MG tablet 90 tablet 1    Sig: Take 1 tablet (10 mg total) by mouth daily.     Ear, Nose, and Throat:  Antihistamines Passed - 07/03/2020  4:59 PM      Passed - Valid encounter within last 12 months    Recent Outpatient Visits          6 months ago Uncontrolled hypertension   [facility], Gerlene Burdock, NP   1 year ago Uncontrolled hypertension   Primary Care at [facility], Manus Rudd, MD   1 year ago Uncontrolled hypertension   Primary Care at [facility], Meda Coffee, MD   1 year ago Uncontrolled hypertension   Primary Care at [facility], [place] A, MD   1 year ago Uncontrolled hypertension   Primary Care at [facility], [place] A, MD             . fluticasone (FLONASE) 50 MCG/ACT nasal spray 16 g 1    Sig: Use 2 spray(s) in each nostril once daily     Ear, Nose, and Throat: Nasal Preparations - Corticosteroids Passed - 07/03/2020  4:59 PM      Passed - Valid encounter within last 12 months    Recent Outpatient Visits          6 months ago Uncontrolled hypertension   [facility], Gerlene Burdock, NP   1 year ago Uncontrolled hypertension   Primary Care at [facility], Manus Rudd, MD   1 year ago Uncontrolled hypertension   Primary Care at [facility], Meda Coffee, MD   1 year ago Uncontrolled hypertension   Primary Care at [facility], [place] A, MD   1 year ago Uncontrolled hypertension   Primary Care at [facility], [place] A, MD             . metoprolol tartrate (LOPRESSOR) 50 MG tablet 180 tablet     Sig: Take 1 tablet (50 mg total) by mouth 2 (two) times daily.     Cardiovascular:  Beta Blockers Failed - 07/03/2020  4:59 PM      Failed - Last BP in normal range    BP Readings from Last 1 Encounters:  12/16/19 (!) 178/111         Failed - Valid encounter within last 6 months    Recent Outpatient Visits          6 months ago  Uncontrolled hypertension   [facility], Gerlene Burdock, NP   1 year ago Uncontrolled hypertension   Primary Care at [facility], Manus Rudd, MD   1 year ago Uncontrolled hypertension   Primary Care at [facility], Meda Coffee, MD   1 year ago Uncontrolled hypertension   Primary Care at [facility], Manus Rudd, MD   1 year ago Uncontrolled hypertension   Primary Care at [facility], [place], MD             Passed - Last Heart Rate in normal range    Pulse Readings from Last 1 Encounters:  12/16/19 76

## 2020-07-03 NOTE — Telephone Encounter (Signed)
Requested medication (s) are due for refill today: yes  Requested medication (s) are on the active medication list: yes  Last refill:  12/16/19  Future visit scheduled: no  Notes to clinic:  needs appt   Requested Prescriptions  Pending Prescriptions Disp Refills   metoprolol tartrate (LOPRESSOR) 50 MG tablet 180 tablet 0    Sig: Take 1 tablet (50 mg total) by mouth 2 (two) times daily.      Cardiovascular:  Beta Blockers Failed - 07/03/2020  5:28 PM      Failed - Last BP in normal range    BP Readings from Last 1 Encounters:  12/16/19 (!) 178/111          Failed - Valid encounter within last 6 months    Recent Outpatient Visits           6 months ago Uncontrolled hypertension   [facility], Gerlene Burdock, NP   1 year ago Uncontrolled hypertension   Primary Care at [facility], Manus Rudd, MD   1 year ago Uncontrolled hypertension   Primary Care at [facility], Meda Coffee, MD   1 year ago Uncontrolled hypertension   Primary Care at [facility], [place], MD   1 year ago Uncontrolled hypertension   Primary Care at [facility], [place], MD                Passed - Last Heart Rate in normal range    Pulse Readings from Last 1 Encounters:  12/16/19 76           Signed Prescriptions Disp Refills   cetirizine (ZYRTEC) 10 MG tablet 90 tablet 1    Sig: Take 1 tablet (10 mg total) by mouth daily.      Ear, Nose, and Throat:  Antihistamines Passed - 07/03/2020  4:59 PM      Passed - Valid encounter within last 12 months    Recent Outpatient Visits           6 months ago Uncontrolled hypertension   [facility] at [facility], Gerlene Burdock, NP   1 year ago Uncontrolled hypertension   Primary Care at [facility], Manus Rudd, MD   1 year ago Uncontrolled hypertension   Primary Care at [facility], Meda Coffee, MD   1 year ago Uncontrolled hypertension   Primary Care at [facility], [place] A, MD   1 year ago Uncontrolled hypertension    Primary Care at [facility], [place] A, MD                  fluticasone (FLONASE) 50 MCG/ACT nasal spray 16 g 1    Sig: Use 2 spray(s) in each nostril once daily      Ear, Nose, and Throat: Nasal Preparations - Corticosteroids Passed - 07/03/2020  4:59 PM      Passed - Valid encounter within last 12 months    Recent Outpatient Visits           6 months ago Uncontrolled hypertension   [facility] at [facility], Gerlene Burdock, NP   1 year ago Uncontrolled hypertension   Primary Care at [facility], Manus Rudd, MD   1 year ago Uncontrolled hypertension   Primary Care at [facility], Meda Coffee, MD   1 year ago Uncontrolled hypertension   Primary Care at [facility], Manus Rudd, MD   1 year ago Uncontrolled hypertension   Primary Care at [facility], Manus Rudd, MD  fluticasone (FLONASE) 50 MCG/ACT nasal spray 16 g 1    Sig: Use 2 spray(s) in each nostril once daily      There is no refill protocol information for this order      cetirizine (ZYRTEC) 10 MG tablet 90 tablet 1    Sig: Take 1 tablet (10 mg total) by mouth daily.      There is no refill protocol information for this order

## 2020-07-03 NOTE — Telephone Encounter (Signed)
Medication Refill - Medication:  cetirizine (ZYRTEC) 10 MG tablet   fluticasone (FLONASE) 50 MCG/ACT nasal spray   metoprolol tartrate (LOPRESSOR) 50 MG tablet    Has the patient contacted their pharmacy? Yes.  contact pcp office.    Preferred Pharmacy (with phone number or street name):   [facility] [address] [place], [place], [place] [address] [phone]   Agent: Please be advised that RX refills may take up to 3 business days. We ask that you follow-up with your pharmacy.

## 2020-07-03 NOTE — Addendum Note (Signed)
Addended by: Garrison Columbus on: 07/03/2020 05:28 PM   Modules accepted: Orders

## 2020-07-04 ENCOUNTER — Other Ambulatory Visit: Payer: Self-pay | Admitting: Emergency Medicine

## 2020-07-04 ENCOUNTER — Telehealth: Payer: Self-pay | Admitting: Emergency Medicine

## 2020-07-04 DIAGNOSIS — Z760 Encounter for issue of repeat prescription: Secondary | ICD-10-CM

## 2020-07-04 MED ORDER — METOPROLOL TARTRATE 50 MG PO TABS: 50.0000 mg | ORAL_TABLET | Freq: Two times a day (BID) | ORAL | 0 refills | Status: DC

## 2020-07-04 NOTE — Telephone Encounter (Signed)
Left a msg on patient machine that I have faxed his metoprolol to [facility] on [address]. If he have any questions to give our office a call

## 2020-07-04 NOTE — Telephone Encounter (Signed)
Pt called and stated this should have been sent to [facility] on [address]/ please resend to  [facility] [address] [place] - [address] Phone:  [phone]  Fax:  [phone]

## 2020-07-04 NOTE — Telephone Encounter (Signed)
Rx has been changed to [facility] [address]

## 2020-07-13 ENCOUNTER — Other Ambulatory Visit: Payer: Self-pay | Admitting: Registered Nurse

## 2020-07-13 ENCOUNTER — Ambulatory Visit: Payer: Self-pay | Admitting: Registered Nurse

## 2020-07-13 DIAGNOSIS — Z760 Encounter for issue of repeat prescription: Secondary | ICD-10-CM

## 2020-07-13 DIAGNOSIS — Z889 Allergy status to unspecified drugs, medicaments and biological substances status: Secondary | ICD-10-CM

## 2020-07-13 DIAGNOSIS — E119 Type 2 diabetes mellitus without complications: Secondary | ICD-10-CM

## 2020-07-13 DIAGNOSIS — E785 Hyperlipidemia, unspecified: Secondary | ICD-10-CM

## 2020-07-13 DIAGNOSIS — I10 Essential (primary) hypertension: Secondary | ICD-10-CM

## 2020-07-13 DIAGNOSIS — F4323 Adjustment disorder with mixed anxiety and depressed mood: Secondary | ICD-10-CM

## 2020-07-13 MED ORDER — CETIRIZINE HCL 10 MG PO TABS: 10.0000 mg | ORAL_TABLET | Freq: Every day | ORAL | 0 refills | Status: DC

## 2020-07-13 MED ORDER — FLUOXETINE HCL 40 MG PO CAPS: 40.0000 mg | ORAL_CAPSULE | Freq: Every day | ORAL | 0 refills | Status: DC

## 2020-07-13 MED ORDER — ROSUVASTATIN CALCIUM 5 MG PO TABS: 5.0000 mg | ORAL_TABLET | Freq: Every day | ORAL | 0 refills | Status: DC

## 2020-07-13 MED ORDER — FLUTICASONE PROPIONATE 50 MCG/ACT NA SUSP
NASAL | 1 refills | Status: DC
Start: 2020-07-13 — End: 2020-12-20

## 2020-07-13 MED ORDER — METFORMIN HCL 500 MG PO TABS: 500.0000 mg | ORAL_TABLET | Freq: Two times a day (BID) | ORAL | 0 refills | Status: DC

## 2020-07-13 MED ORDER — LOSARTAN POTASSIUM-HCTZ 100-12.5 MG PO TABS: 1.0000 | ORAL_TABLET | Freq: Every day | ORAL | 0 refills | Status: DC

## 2020-07-13 MED ORDER — METOPROLOL TARTRATE 50 MG PO TABS: 50.0000 mg | ORAL_TABLET | Freq: Two times a day (BID) | ORAL | 0 refills | Status: DC

## 2020-07-13 NOTE — Progress Notes (Signed)
Pt declined visit today, asking for courtesy fill Will give fill but given past BP cannot extend beyond 1 mo Pt needs OV and labs   Jari Sportsman, NP

## 2020-11-08 ENCOUNTER — Telehealth: Payer: Self-pay | Admitting: Registered Nurse

## 2020-11-08 NOTE — Telephone Encounter (Signed)
   Are you okay with TOC appointment

## 2020-11-08 NOTE — Telephone Encounter (Signed)
Yes, I am okay with this 

## 2020-11-12 ENCOUNTER — Other Ambulatory Visit: Payer: Self-pay | Admitting: Registered Nurse

## 2020-11-12 DIAGNOSIS — I10 Essential (primary) hypertension: Secondary | ICD-10-CM

## 2020-11-12 DIAGNOSIS — E785 Hyperlipidemia, unspecified: Secondary | ICD-10-CM

## 2020-11-13 MED ORDER — ROSUVASTATIN CALCIUM 5 MG PO TABS: 5.0000 mg | ORAL_TABLET | Freq: Every day | ORAL | 0 refills | Status: DC

## 2020-11-13 MED ORDER — LOSARTAN POTASSIUM-HCTZ 100-12.5 MG PO TABS: 1.0000 | ORAL_TABLET | Freq: Every day | ORAL | 0 refills | Status: DC

## 2020-12-18 ENCOUNTER — Telehealth: Payer: Self-pay

## 2020-12-18 DIAGNOSIS — Z760 Encounter for issue of repeat prescription: Secondary | ICD-10-CM

## 2020-12-18 DIAGNOSIS — F4323 Adjustment disorder with mixed anxiety and depressed mood: Secondary | ICD-10-CM

## 2020-12-18 DIAGNOSIS — E785 Hyperlipidemia, unspecified: Secondary | ICD-10-CM

## 2020-12-18 DIAGNOSIS — I10 Essential (primary) hypertension: Secondary | ICD-10-CM

## 2020-12-18 DIAGNOSIS — E119 Type 2 diabetes mellitus without complications: Secondary | ICD-10-CM

## 2020-12-18 DIAGNOSIS — Z889 Allergy status to unspecified drugs, medicaments and biological substances status: Secondary | ICD-10-CM

## 2020-12-18 NOTE — Telephone Encounter (Signed)
Please advise as the pt has stated he needed to r/s his September apptmnt with Dr. Alvy Bimler due to a death in his family and the next aval apptmnt will be on 03/25/2021. Pt states he needs all his meds refilled as if he goes that long w/o them it can make him very ill.  Pt was r/s for the December apptmnt but is in need of his medications as soon as possible.

## 2020-12-20 ENCOUNTER — Encounter: Payer: Self-pay | Admitting: Emergency Medicine

## 2020-12-20 MED ORDER — FLUOXETINE HCL 40 MG PO CAPS
40.0000 mg | ORAL_CAPSULE | Freq: Every day | ORAL | 3 refills | Status: DC
Start: 2020-12-20 — End: 2021-07-04

## 2020-12-20 MED ORDER — ROSUVASTATIN CALCIUM 5 MG PO TABS: 5.0000 mg | ORAL_TABLET | Freq: Every day | ORAL | 3 refills | Status: DC

## 2020-12-20 MED ORDER — METOPROLOL TARTRATE 50 MG PO TABS: 50.0000 mg | ORAL_TABLET | Freq: Two times a day (BID) | ORAL | 3 refills | Status: DC

## 2020-12-20 MED ORDER — CETIRIZINE HCL 10 MG PO TABS: 10.0000 mg | ORAL_TABLET | Freq: Every day | ORAL | 3 refills | Status: DC

## 2020-12-20 MED ORDER — METFORMIN HCL 500 MG PO TABS: 500.0000 mg | ORAL_TABLET | Freq: Two times a day (BID) | ORAL | 3 refills | Status: DC

## 2020-12-20 MED ORDER — LOSARTAN POTASSIUM-HCTZ 100-12.5 MG PO TABS: 1.0000 | ORAL_TABLET | Freq: Every day | ORAL | 3 refills | Status: DC

## 2020-12-20 MED ORDER — FLUTICASONE PROPIONATE 50 MCG/ACT NA SUSP: NASAL | 1 refills | Status: DC

## 2020-12-20 NOTE — Addendum Note (Signed)
Addended by: Nena Polio on: 12/20/2020 10:23 AM   Modules accepted: Orders

## 2020-12-20 NOTE — Telephone Encounter (Signed)
Refilled medication until pt OV in December.

## 2021-02-01 ENCOUNTER — Ambulatory Visit (INDEPENDENT_AMBULATORY_CARE_PROVIDER_SITE_OTHER): Payer: Self-pay | Admitting: Registered Nurse

## 2021-02-01 ENCOUNTER — Other Ambulatory Visit: Payer: Self-pay

## 2021-02-01 ENCOUNTER — Encounter: Payer: Self-pay | Admitting: Registered Nurse

## 2021-02-01 VITALS — BP 178/120 | Temp 98.3°F | Resp 18 | Ht 72.0 in | Wt 202.5 lb

## 2021-02-01 DIAGNOSIS — Z021 Encounter for pre-employment examination: Secondary | ICD-10-CM

## 2021-02-01 NOTE — Progress Notes (Addendum)
Established Patient Office Visit  Subjective:  Patient ID: Cameron Wells, male    DOB: [birth year]  Age: 50 y.o. MRN: [id number]  CC: No chief complaint on file.   HPI Cameron Wells presents for preemployment exam  No acute concerns  Has work paperwork to be filled out  Past Medical History:  Diagnosis Date   Allergy    Anxiety    Asthma    Hypertension     History reviewed. No pertinent surgical history.  Family History  Problem Relation Age of Onset   CAD Maternal Grandmother    Cancer Paternal Grandfather    Hypertension Maternal Aunt     Social History   Socioeconomic History   Marital status: Married    Spouse name: Not on file   Number of children: Not on file   Years of education: Not on file   Highest education level: Not on file  Occupational History   Not on file  Tobacco Use   Smoking status: Former   Smokeless tobacco: Never  Vaping Use   Vaping Use: Never used  Substance and Sexual Activity   Alcohol use: Yes    Alcohol/week: 0.0 standard drinks   Drug use: No   Sexual activity: Not on file  Other Topics Concern   Not on file  Social History Narrative   Not on file   Social Determinants of Health   Financial Resource Strain: Not on file  Food Insecurity: Not on file  Transportation Needs: Not on file  Physical Activity: Not on file  Stress: Not on file  Social Connections: Not on file  Intimate Partner Violence: Not on file    Outpatient Medications Prior to Visit  Medication Sig Dispense Refill   cetirizine (ZYRTEC) 10 MG tablet Take 1 tablet (10 mg total) by mouth daily. 30 tablet 3   FLUoxetine (PROZAC) 40 MG capsule Take 1 capsule (40 mg total) by mouth daily. 30 capsule 3   fluticasone (FLONASE) 50 MCG/ACT nasal spray Use 2 spray(s) in each nostril once daily 16 g 1   losartan-hydrochlorothiazide (HYZAAR) 100-12.5 MG tablet Take 1 tablet by mouth daily. 30 tablet 0   losartan-hydrochlorothiazide (HYZAAR) 100-12.5 MG tablet Take 1  tablet by mouth daily. 30 tablet 3   metFORMIN (GLUCOPHAGE) 500 MG tablet Take 1 tablet (500 mg total) by mouth 2 (two) times daily with a meal. 60 tablet 3   metoprolol tartrate (LOPRESSOR) 50 MG tablet Take 1 tablet (50 mg total) by mouth 2 (two) times daily. 60 tablet 3   omeprazole (PRILOSEC OTC) 20 MG tablet Take 20 mg by mouth daily.     rosuvastatin (CRESTOR) 5 MG tablet Take 1 tablet (5 mg total) by mouth daily. 30 tablet 0   rosuvastatin (CRESTOR) 5 MG tablet Take 1 tablet (5 mg total) by mouth daily. 30 tablet 3   No facility-administered medications prior to visit.    Allergies  Allergen Reactions   Penicillins Anaphylaxis    ROS Review of Systems  Constitutional: Negative.   HENT: Negative.    Eyes: Negative.   Respiratory: Negative.    Cardiovascular: Negative.   Gastrointestinal: Negative.   Genitourinary: Negative.   Musculoskeletal: Negative.   Skin: Negative.   Neurological: Negative.   Psychiatric/Behavioral: Negative.    All other systems reviewed and are negative.    Objective:    Physical Exam Vitals and nursing note reviewed.  Constitutional:      General: He is not in acute distress.  Appearance: Normal appearance. He is normal weight. He is not ill-appearing, toxic-appearing or diaphoretic.  HENT:     Head: Normocephalic and atraumatic.     Right Ear: Tympanic membrane, ear canal and external ear normal. There is no impacted cerumen.     Left Ear: Tympanic membrane, ear canal and external ear normal. There is no impacted cerumen.     Nose: Nose normal. No congestion or rhinorrhea.     Mouth/Throat:     Mouth: Mucous membranes are moist.     Pharynx: Oropharynx is clear. No oropharyngeal exudate or posterior oropharyngeal erythema.  Eyes:     General: No scleral icterus.       Right eye: No discharge.        Left eye: No discharge.     Extraocular Movements: Extraocular movements intact.     Conjunctiva/sclera: Conjunctivae normal.      Pupils: Pupils are equal, round, and reactive to light.  Neck:     Vascular: No carotid bruit.  Cardiovascular:     Rate and Rhythm: Normal rate and regular rhythm.     Pulses: Normal pulses.     Heart sounds: Normal heart sounds. No murmur heard.   No friction rub. No gallop.  Pulmonary:     Effort: Pulmonary effort is normal. No respiratory distress.     Breath sounds: Normal breath sounds. No stridor. No wheezing, rhonchi or rales.  Chest:     Chest wall: No tenderness.  Abdominal:     General: Abdomen is flat. Bowel sounds are normal. There is no distension.     Palpations: Abdomen is soft. There is no mass.     Tenderness: There is no abdominal tenderness. There is no right CVA tenderness, left CVA tenderness, guarding or rebound.     Hernia: No hernia is present.  Musculoskeletal:        General: No swelling, tenderness, deformity or signs of injury. Normal range of motion.     Cervical back: Normal range of motion and neck supple. No rigidity or tenderness.     Right lower leg: No edema.     Left lower leg: No edema.  Lymphadenopathy:     Cervical: No cervical adenopathy.  Skin:    General: Skin is warm and dry.     Capillary Refill: Capillary refill takes less than 2 seconds.     Coloration: Skin is not jaundiced or pale.     Findings: No bruising, erythema, lesion or rash.  Neurological:     General: No focal deficit present.     Mental Status: He is alert and oriented to person, place, and time. Mental status is at baseline.     Cranial Nerves: No cranial nerve deficit.     Motor: No weakness.     Gait: Gait normal.  Psychiatric:        Mood and Affect: Mood normal.        Behavior: Behavior normal.        Thought Content: Thought content normal.        Judgment: Judgment normal.    Temp 98.3 F (36.8 C) (Temporal)   Resp 18   Ht 6' (1.829 m)   Wt 202 lb 8 oz (91.9 kg)   BMI 27.46 kg/m  Wt Readings from Last 3 Encounters:  02/01/21 202 lb 8 oz (91.9 kg)   12/16/19 233 lb (105.7 kg)  04/27/19 237 lb 6.4 oz (107.7 kg)     Health Maintenance Due  Topic Date Due   COVID-19 Vaccine (1) Never done   FOOT EXAM  Never done   HIV Screening  Never done   Hepatitis C Screening  Never done   COLONOSCOPY (Pts 45-36yrs Insurance coverage will need to be confirmed)  Never done   Zoster Vaccines- Shingrix (1 of 2) Never done   HEMOGLOBIN A1C  06/17/2020   INFLUENZA VACCINE  Never done    There are no preventive care reminders to display for this patient.  Lab Results  Component Value Date   TSH 0.664 12/16/2019   Lab Results  Component Value Date   WBC 6.6 12/16/2019   HGB 13.7 12/16/2019   HCT 42.1 12/16/2019   MCV 91 12/16/2019   PLT 187 06/07/2014   Lab Results  Component Value Date   NA 144 12/16/2019   K 4.3 12/16/2019   CO2 23 12/16/2019   GLUCOSE 131 (H) 12/16/2019   BUN 15 12/16/2019   CREATININE 1.27 12/16/2019   BILITOT 0.5 12/16/2019   ALKPHOS 61 12/16/2019   AST 22 12/16/2019   ALT 28 12/16/2019   PROT 7.4 12/16/2019   ALBUMIN 4.8 12/16/2019   CALCIUM 9.6 12/16/2019   ANIONGAP 8 06/07/2014   Lab Results  Component Value Date   CHOL 239 (H) 12/16/2019   Lab Results  Component Value Date   HDL 34 (L) 12/16/2019   Lab Results  Component Value Date   LDLCALC 130 (H) 12/16/2019   Lab Results  Component Value Date   TRIG 415 (H) 12/16/2019   Lab Results  Component Value Date   CHOLHDL 7.0 (H) 12/16/2019   Lab Results  Component Value Date   HGBA1C 7.6 (H) 12/16/2019      Assessment & Plan:   Problem List Items Addressed This Visit   None Visit Diagnoses     Pre-employment examination    -  Primary       No orders of the defined types were placed in this encounter.   Follow-up: Return in about 3 weeks (around 02/22/2021) for t2dm,htn,hld.   PLAN Unremarkable preemployment exam Needs to resume htn medication as he is very hypertensive. No symptoms today. He will return in 3 weeks for  labs and chronic care managemnet Patient encouraged to call clinic with any questions, comments, or concerns.  Janeece Agee, NP

## 2021-02-01 NOTE — Patient Instructions (Addendum)
Mr. Gervasi -   [name] to see you  No concerns with you starting this job  Let me know if you need anything  See you in a few weeks Thanks,  Rich     If you have lab work done today you will be contacted with your lab results within the next 2 weeks.  If you have not heard from [place] then please contact us. The fastest way to get your results is to register for My Chart.   IF you received an x-ray today, you will receive an invoice from [facility]. Please contact [facility] at [phone] with questions or concerns regarding your invoice.   IF you received labwork today, you will receive an invoice from [facility]. Please contact [facility] at [phone] with questions or concerns regarding your invoice.   Our billing staff will not be able to assist you with questions regarding bills from these companies.  You will be contacted with the lab results as soon as they are available. The fastest way to get your results is to activate your My Chart account. Instructions are located on the last page of this paperwork. If you have not heard from [place] regarding the results in 2 weeks, please contact this office.

## 2021-02-01 NOTE — Addendum Note (Signed)
Addended by: Janeece Agee on: 02/01/2021 03:40 PM   Modules accepted: Orders

## 2021-02-22 ENCOUNTER — Ambulatory Visit: Payer: Self-pay | Admitting: Registered Nurse

## 2021-03-13 ENCOUNTER — Encounter (HOSPITAL_COMMUNITY): Payer: Self-pay

## 2021-03-13 ENCOUNTER — Emergency Department (HOSPITAL_COMMUNITY)
Admission: EM | Admit: 2021-03-13 | Discharge: 2021-03-13 | Disposition: A | Discharge status: Home / Self Care | Payer: Self-pay | Attending: Emergency Medicine | Admitting: Emergency Medicine

## 2021-03-13 ENCOUNTER — Other Ambulatory Visit: Payer: Self-pay

## 2021-03-13 DIAGNOSIS — T782XXA Anaphylactic shock, unspecified, initial encounter: Secondary | ICD-10-CM | POA: Insufficient documentation

## 2021-03-13 DIAGNOSIS — R220 Localized swelling, mass and lump, head: Secondary | ICD-10-CM | POA: Insufficient documentation

## 2021-03-13 DIAGNOSIS — I10 Essential (primary) hypertension: Secondary | ICD-10-CM | POA: Insufficient documentation

## 2021-03-13 DIAGNOSIS — J45909 Unspecified asthma, uncomplicated: Secondary | ICD-10-CM | POA: Insufficient documentation

## 2021-03-13 DIAGNOSIS — Z87891 Personal history of nicotine dependence: Secondary | ICD-10-CM | POA: Insufficient documentation

## 2021-03-13 MED ORDER — EPINEPHRINE 0.3 MG/0.3ML IJ SOAJ: 0.3000 mg | INTRAMUSCULAR | 0 refills | Status: AC | PRN

## 2021-03-13 MED ORDER — DIPHENHYDRAMINE HCL 50 MG/ML IJ SOLN
50.0000 mg | Freq: Once | INTRAMUSCULAR | Status: AC
  Administered 2021-03-13: 50 mg via INTRAVENOUS
  Filled 2021-03-13: qty 1

## 2021-03-13 MED ORDER — FAMOTIDINE IN NACL 20-0.9 MG/50ML-% IV SOLN
20.0000 mg | Freq: Once | INTRAVENOUS | Status: AC
  Administered 2021-03-13: 20 mg via INTRAVENOUS
  Filled 2021-03-13: qty 50

## 2021-03-13 MED ORDER — PREDNISONE 10 MG PO TABS: 20.0000 mg | ORAL_TABLET | Freq: Every day | ORAL | 0 refills | Status: AC

## 2021-03-13 MED ORDER — EPINEPHRINE 0.3 MG/0.3ML IJ SOAJ
0.3000 mg | Freq: Once | INTRAMUSCULAR | Status: AC
  Administered 2021-03-13: 0.3 mg via INTRAMUSCULAR
  Filled 2021-03-13: qty 0.3

## 2021-03-13 MED ORDER — SODIUM CHLORIDE 0.9 % IV BOLUS
500.0000 mL | Freq: Once | INTRAVENOUS | Status: AC
  Administered 2021-03-13: 500 mL via INTRAVENOUS

## 2021-03-13 MED ORDER — METHYLPREDNISOLONE SODIUM SUCC 125 MG IJ SOLR
125.0000 mg | Freq: Once | INTRAMUSCULAR | Status: AC
  Administered 2021-03-13: 125 mg via INTRAVENOUS
  Filled 2021-03-13: qty 2

## 2021-03-13 NOTE — ED Triage Notes (Signed)
Pt came POV from home. Swelling to the bilat eyes, lips, throat started last night around 2330. Denies any other symptoms. Pt is allergic to PCN and does take BP medications. Swelling noted to bilat eyes and lips

## 2021-03-13 NOTE — ED Provider Notes (Signed)
Emergency Department Provider Note   I have reviewed the triage vital signs and the nursing notes.   HISTORY  Chief Complaint Allergic Reaction   HPI Cameron Wells is a 50 y.o. male with PMH reviewed presents to the emergency department for evaluation of swelling to the face and subjective tightness in the throat.  Symptoms began last night after eating some vegetables.  He states he has had some mild itching and swelling after eating similar foods in the past but nothing this severe.  He denies any new medications or herbal supplements.  He is not feeling particularly short of breath but does feel increased tightness in the throat.  He initially had itching and swelling around the eyes and then the lips.  He is not having abdominal pain, vomiting, diarrhea.  No diffuse rash or hives. No meds taken PTA.    Past Medical History:  Diagnosis Date   Allergy    Anxiety    Asthma    Hypertension     Patient Active Problem List   Diagnosis Date Noted   Newly diagnosed diabetes (HCC) 01/03/2020   Dyslipidemia 04/15/2018   Adjustment disorder with mixed anxiety and depressed mood 08/10/2014   Panic attack 08/10/2014   Uncontrolled hypertension 06/19/2014    History reviewed. No pertinent surgical history.  Allergies Penicillins  Family History  Problem Relation Age of Onset   CAD Maternal Grandmother    Cancer Paternal Grandfather    Hypertension Maternal Aunt     Social History Social History   Tobacco Use   Smoking status: Former   Smokeless tobacco: Never  Building services engineer Use: Never used  Substance Use Topics   Alcohol use: Yes    Alcohol/week: 0.0 standard drinks   Drug use: No    Review of Systems  Constitutional: No fever/chills Eyes: No visual changes. ENT: Positive throat tightness.  Cardiovascular: Denies chest pain. Respiratory: Denies shortness of breath. Gastrointestinal: No abdominal pain.  No nausea, no vomiting.  No diarrhea.  No  constipation. Musculoskeletal: Negative for back pain. Skin: Negative for rash. Swelling to the eyes and lips with itching.  Neurological: Negative for headaches  10-point ROS otherwise negative.  ____________________________________________   PHYSICAL EXAM:  VITAL SIGNS: ED Triage Vitals  Enc Vitals Group     BP 03/13/21 0604 (!) 183/119     Pulse Rate 03/13/21 0604 91     Resp 03/13/21 0604 12     Temp 03/13/21 0604 98.8 F (37.1 C)     Temp Source 03/13/21 0604 Oral     SpO2 03/13/21 0604 98 %     Weight 03/13/21 0606 200 lb (90.7 kg)     Height 03/13/21 0606 5\' 11"  (1.803 m)   Constitutional: Alert and oriented. Well appearing and in no acute distress. Eyes: Conjunctivae are normal.  Mild periorbital edema worse on the left.  No warmth or erythema. Head: Atraumatic. Nose: No congestion/rhinnorhea. Mouth/Throat: Mucous membranes are moist.  Oropharynx non-erythematous.  Anterior swelling to the upper and lower lips.  No appreciable tongue swelling.  Clear voice.  No trismus.  Neck: No stridor.   Cardiovascular: Normal rate, regular rhythm. Good peripheral circulation. Grossly normal heart sounds.   Respiratory: Normal respiratory effort.  No retractions. Lungs CTAB. Gastrointestinal: No distention.  Musculoskeletal: No gross deformities of extremities. Neurologic:  Normal speech and language.  Skin:  Skin is warm, dry and intact. No rash noted.  ____________________________________________  EKG   EKG Interpretation  Date/Time:  Wednesday March 13 2021 06:05:11 EST Ventricular Rate:  85 PR Interval:  189 QRS Duration: 88 QT Interval:  385 QTC Calculation: 458 R Axis:   71 Text Interpretation: Sinus rhythm Borderline T wave abnormalities Confirmed by Alona Bene [phone]) on 03/13/2021 6:08:30 AM       ____________________________________________   PROCEDURES  Procedure(s) performed:   .Critical Care Performed by: Maia Plan, MD Authorized by:  Maia Plan, MD   Critical care provider statement:    Critical care time (minutes):  30   Critical care time was exclusive of:  Separately billable procedures and treating other patients and teaching time   Critical care was necessary to treat or prevent imminent or life-threatening deterioration of the following conditions: anaphylaxis.   Critical care was time spent personally by me on the following activities:  Development of treatment plan with patient or surrogate, evaluation of patient's response to treatment, examination of patient, ordering and performing treatments and interventions, pulse oximetry, re-evaluation of patient's condition and review of old charts   I assumed direction of critical care for this patient from another provider in my specialty: no     ____________________________________________   INITIAL IMPRESSION / ASSESSMENT AND PLAN / ED COURSE  Pertinent labs & imaging results that were available during my care of the patient were reviewed by me and considered in my medical decision making (see chart for details).   Patient presents emergency department with swelling to the face and lips.  Most of the visible swelling is anterior although patient does report some subjective throat tightness.  Symptoms have progressed since around 11:30 PM yesterday.  No appreciable rash or abdominal symptoms.  He has not hypotensive.  With subjective throat tightness, however, and visible angioedema do plan for epinephrine along with steroid, Benadryl, H2 blocker.   06:45 AM  Patient reports feeling no worse after Epi and perhaps slightly improved. Subjectively, the visible swelling seems reduced on my re-evaluation. Will continue to monitor in the ED.   Care transferred to the oncoming team for additional observation.  ____________________________________________  FINAL CLINICAL IMPRESSION(S) / ED DIAGNOSES  Final diagnoses:  Anaphylaxis, initial encounter     MEDICATIONS  GIVEN DURING THIS VISIT:  Medications  EPINEPHrine (EPI-PEN) injection 0.3 mg (0.3 mg Intramuscular Given 03/13/21 0626)  sodium chloride 0.9 % bolus 500 mL (500 mLs Intravenous New Bag/Given 03/13/21 2841)  methylPREDNISolone sodium succinate (SOLU-MEDROL) 125 mg/2 mL injection 125 mg (125 mg Intravenous Given 03/13/21 0625)  famotidine (PEPCID) IVPB 20 mg premix (20 mg Intravenous New Bag/Given 03/13/21 0629)  diphenhydrAMINE (BENADRYL) injection 50 mg (50 mg Intravenous Given 03/13/21 0625)     NEW OUTPATIENT MEDICATIONS STARTED DURING THIS VISIT:  New Prescriptions   EPINEPHRINE 0.3 MG/0.3 ML IJ SOAJ INJECTION    Inject 0.3 mg into the muscle as needed for anaphylaxis.    Note:  This document was prepared using Dragon voice recognition software and may include unintentional dictation errors.  Alona Bene, MD, [facility] Emergency Medicine    Khalani Novoa, Arlyss Repress, MD 03/13/21 0700

## 2021-03-13 NOTE — ED Provider Notes (Signed)
  Physical Exam  BP (!) 163/95 (BP Location: Right Arm)   Pulse 90   Temp 98.5 F (36.9 C) (Oral)   Resp 16   Ht 5\' 11"  (1.803 m)   Wt 90.7 kg   SpO2 98%   BMI 27.89 kg/m   Physical Exam  ED Course/Procedures     Procedures  MDM  Received care of pt from Dr. . Developed dyspnea and swelling after eating vegetables, improved with epi. Plan to monitor.  Pt also states had some allergic reaction to a type of vegetable in the past but not sure what kind. Observed in the ED without worsening of symptoms. Still has periorbital swelling. Give prednisone rx for 2 days, discussed risk of hyperglycemia, given rx for epi pen by Dr. Jacqulyn Bath. Patient discharged in stable condition with understanding of reasons to return.        Jacqulyn Bath, MD 03/14/21 [phone]

## 2021-03-13 NOTE — Discharge Instructions (Addendum)

## 2021-03-13 NOTE — ED Notes (Signed)
Provider at bedside

## 2021-03-13 NOTE — ED Notes (Signed)
ED Provider at bedside. 

## 2021-03-25 ENCOUNTER — Encounter: Payer: Self-pay | Admitting: Emergency Medicine

## 2021-07-04 ENCOUNTER — Other Ambulatory Visit: Payer: Self-pay

## 2021-07-04 ENCOUNTER — Telehealth: Payer: Self-pay | Admitting: Registered Nurse

## 2021-07-04 MED ORDER — METOPROLOL TARTRATE 50 MG PO TABS: 50.0000 mg | ORAL_TABLET | Freq: Two times a day (BID) | ORAL | 0 refills | Status: DC

## 2021-07-04 MED ORDER — METFORMIN HCL 500 MG PO TABS: 500.0000 mg | ORAL_TABLET | Freq: Two times a day (BID) | ORAL | 0 refills | Status: DC

## 2021-07-04 MED ORDER — FLUTICASONE PROPIONATE 50 MCG/ACT NA SUSP: NASAL | 0 refills | Status: DC

## 2021-07-04 MED ORDER — FLUOXETINE HCL 40 MG PO CAPS: 40.0000 mg | ORAL_CAPSULE | Freq: Every day | ORAL | 0 refills | Status: DC

## 2021-07-04 MED ORDER — CETIRIZINE HCL 10 MG PO TABS: 10.0000 mg | ORAL_TABLET | Freq: Every day | ORAL | 0 refills | Status: DC

## 2021-07-04 MED ORDER — LOSARTAN POTASSIUM-HCTZ 100-12.5 MG PO TABS: 1.0000 | ORAL_TABLET | Freq: Every day | ORAL | 0 refills | Status: DC

## 2021-07-04 MED ORDER — ROSUVASTATIN CALCIUM 5 MG PO TABS: 5.0000 mg | ORAL_TABLET | Freq: Every day | ORAL | 0 refills | Status: DC

## 2021-07-04 NOTE — Telephone Encounter (Signed)
Refills have been sent in for 30 days worth until that appointment date ans then we will resume previous refill quantity after visit  ?

## 2021-07-04 NOTE — Telephone Encounter (Signed)
Pt called in asking for refills on all his meds, he has an appt with Richard on 07/16/21, pt uses [facility] on [address]  ? ?Please advise  ?

## 2021-07-16 ENCOUNTER — Ambulatory Visit (INDEPENDENT_AMBULATORY_CARE_PROVIDER_SITE_OTHER): Payer: Self-pay | Admitting: Registered Nurse

## 2021-07-16 ENCOUNTER — Encounter: Payer: Self-pay | Admitting: Registered Nurse

## 2021-07-16 VITALS — BP 158/78 | HR 92 | Temp 99.6°F | Resp 16 | Ht 71.0 in | Wt 209.4 lb

## 2021-07-16 DIAGNOSIS — U071 COVID-19: Secondary | ICD-10-CM

## 2021-07-16 DIAGNOSIS — I10 Essential (primary) hypertension: Secondary | ICD-10-CM

## 2021-07-16 DIAGNOSIS — Z760 Encounter for issue of repeat prescription: Secondary | ICD-10-CM

## 2021-07-16 DIAGNOSIS — E119 Type 2 diabetes mellitus without complications: Secondary | ICD-10-CM

## 2021-07-16 DIAGNOSIS — E785 Hyperlipidemia, unspecified: Secondary | ICD-10-CM

## 2021-07-16 DIAGNOSIS — F4323 Adjustment disorder with mixed anxiety and depressed mood: Secondary | ICD-10-CM

## 2021-07-16 LAB — POC COVID19 BINAXNOW: SARS Coronavirus 2 Ag: POSITIVE — AB

## 2021-07-16 MED ORDER — FLUOXETINE HCL 40 MG PO CAPS: 40.0000 mg | ORAL_CAPSULE | Freq: Every day | ORAL | 0 refills | Status: DC

## 2021-07-16 MED ORDER — LOSARTAN POTASSIUM-HCTZ 100-12.5 MG PO TABS: 1.0000 | ORAL_TABLET | Freq: Every day | ORAL | 0 refills | Status: DC

## 2021-07-16 MED ORDER — METFORMIN HCL 500 MG PO TABS: 500.0000 mg | ORAL_TABLET | Freq: Two times a day (BID) | ORAL | 0 refills | Status: DC

## 2021-07-16 MED ORDER — NIRMATRELVIR/RITONAVIR (PAXLOVID)TABLET: 3.0000 | ORAL_TABLET | Freq: Two times a day (BID) | ORAL | 0 refills | Status: AC

## 2021-07-16 MED ORDER — METOPROLOL TARTRATE 50 MG PO TABS: 50.0000 mg | ORAL_TABLET | Freq: Two times a day (BID) | ORAL | 0 refills | Status: DC

## 2021-07-16 MED ORDER — ROSUVASTATIN CALCIUM 5 MG PO TABS: 5.0000 mg | ORAL_TABLET | Freq: Every day | ORAL | 0 refills | Status: DC

## 2021-07-16 NOTE — Progress Notes (Signed)
? ?Acute Office Visit ? ?Subjective:  ? ? Patient ID: Cameron BickersOmar Wells, male    DOB: March 18, 1971, 51 y.o.   MRN: [id number] ? ?Chief Complaint  ?Patient presents with  ? Covid Positive  ?  Pt has positive testing yesterday notes it was old test wants to be sure, congestion body aches, cough, fever, chills   ? ? ?HPI ?Patient is in today for COVID+ ? ?Feverish, aches ?Symptoms onset yesterday. ?Some congestion and cough.  ? ?No nvd, chest pain, doe, LOC ? ?Hypertension: ?Patient Currently taking: metoprolol 50mg  po bid, losartan-hctz 100-12.5mg  po qd ?Good effect. No AEs. ?Denies CV symptoms including: chest pain, shob, doe, headache, visual changes, fatigue, claudication, and dependent edema.  ? ?Previous readings and labs: ?BP Readings from Last 3 Encounters:  ?07/16/21 (!) 158/78  ?03/13/21 (!) 155/111  ?02/01/21 (!) 178/120  ? ?Lab Results  ?Component Value Date  ? CREATININE 1.27 12/16/2019  ? ? ?T2dm ?Last A1c:  ?Lab Results  ?Component Value Date  ? HGBA1C 7.6 (H) 12/16/2019  ?  ?Currently taking: metformin 500mg  po bid ac ?No new complications ?Reports good compliance with medications ?Diet has been steady ?Exercise habits have been steady ? ?HLD ?Rosuvastatin 5mg  po qd ?Good effect, no AE ?Lab Results  ?Component Value Date  ? CHOL 239 (H) 12/16/2019  ? HDL 34 (L) 12/16/2019  ? LDLCALC 130 (H) 12/16/2019  ? TRIG 415 (H) 12/16/2019  ? CHOLHDL 7.0 (H) 12/16/2019  ? ? ?Outpatient Medications Prior to Visit  ?Medication Sig Dispense Refill  ? cetirizine (ZYRTEC) 10 MG tablet Take 1 tablet (10 mg total) by mouth daily. 30 tablet 0  ? EPINEPHrine 0.3 mg/0.3 mL IJ SOAJ injection Inject 0.3 mg into the muscle as needed for anaphylaxis. 1 each 0  ? fluticasone (FLONASE) 50 MCG/ACT nasal spray Use 2 spray(s) in each nostril once daily 16 g 0  ? omeprazole (PRILOSEC OTC) 20 MG tablet Take 20 mg by mouth daily.    ? FLUoxetine (PROZAC) 40 MG capsule Take 1 capsule (40 mg total) by mouth daily. 30 capsule 0  ?  losartan-hydrochlorothiazide (HYZAAR) 100-12.5 MG tablet Take 1 tablet by mouth daily. 30 tablet 0  ? metFORMIN (GLUCOPHAGE) 500 MG tablet Take 1 tablet (500 mg total) by mouth 2 (two) times daily with a meal. 60 tablet 0  ? metoprolol tartrate (LOPRESSOR) 50 MG tablet Take 1 tablet (50 mg total) by mouth 2 (two) times daily. 60 tablet 0  ? rosuvastatin (CRESTOR) 5 MG tablet Take 1 tablet (5 mg total) by mouth daily. 30 tablet 0  ? ?No facility-administered medications prior to visit.  ? ? ?Review of Systems ?Per hpi  ? ?   ?Objective:  ?  ?BP (!) 158/78   Pulse 92   Temp 99.6 ?F (37.6 ?C) (Temporal)   Resp 16   Ht 5\' 11"  (1.803 m)   Wt 209 lb 6.4 oz (95 kg)   SpO2 96%   BMI 29.21 kg/m?  ?Physical Exam ?Constitutional:   ?   General: He is not in acute distress. ?   Appearance: Normal appearance. He is normal weight. He is not ill-appearing, toxic-appearing or diaphoretic.  ?Cardiovascular:  ?   Rate and Rhythm: Normal rate and regular rhythm.  ?   Heart sounds: Normal heart sounds. No murmur heard. ?  No friction rub. No gallop.  ?Pulmonary:  ?   Effort: Pulmonary effort is normal. No respiratory distress.  ?   Breath sounds: Normal breath sounds.  No stridor. No wheezing, rhonchi or rales.  ?Chest:  ?   Chest wall: No tenderness.  ?Neurological:  ?   General: No focal deficit present.  ?   Mental Status: He is alert and oriented to person, place, and time. Mental status is at baseline.  ?Psychiatric:     ?   Mood and Affect: Mood normal.     ?   Behavior: Behavior normal.     ?   Thought Content: Thought content normal.     ?   Judgment: Judgment normal.  ? ? ?Results for orders placed or performed in visit on 07/16/21  ?POC COVID-19  ?Result Value Ref Range  ? SARS Coronavirus 2 Ag Positive (A) Negative  ? ? ? ?   ?Assessment & Plan:  ?1. COVID-19 ?- POC COVID-19 ?- nirmatrelvir/ritonavir EUA (PAXLOVID) 20 x 150 MG & 10 x 100MG  TABS; Take 3 tablets by mouth 2 (two) times daily for 5 days. (Take nirmatrelvir  150 mg two tablets twice daily for 5 days and ritonavir 100 mg one tablet twice daily for 5 days) Patient GFR is >60  Dispense: 30 tablet; Refill: 0 ? ?2. Newly diagnosed diabetes (HCC) ?- CBC with Differential/Platelet; Future ?- Comprehensive metabolic panel; Future ?- Hemoglobin A1c; Future ?- Lipid panel; Future ?- metFORMIN (GLUCOPHAGE) 500 MG tablet; Take 1 tablet (500 mg total) by mouth 2 (two) times daily with a meal.  Dispense: 180 tablet; Refill: 0 ? ?3. Medication refill ?- metoprolol tartrate (LOPRESSOR) 50 MG tablet; Take 1 tablet (50 mg total) by mouth 2 (two) times daily.  Dispense: 180 tablet; Refill: 0 ? ?4. Dyslipidemia ?- rosuvastatin (CRESTOR) 5 MG tablet; Take 1 tablet (5 mg total) by mouth daily.  Dispense: 90 tablet; Refill: 0 ? ?5. Adjustment disorder with mixed anxiety and depressed mood ?- FLUoxetine (PROZAC) 40 MG capsule; Take 1 capsule (40 mg total) by mouth daily.  Dispense: 90 capsule; Refill: 0 ? ?6. Essential hypertension ?- losartan-hydrochlorothiazide (HYZAAR) 100-12.5 MG tablet; Take 1 tablet by mouth daily.  Dispense: 90 tablet; Refill: 0 ? ?7. Uncontrolled hypertension ? ? ? ?Meds ordered this encounter  ?Medications  ? metFORMIN (GLUCOPHAGE) 500 MG tablet  ?  Sig: Take 1 tablet (500 mg total) by mouth 2 (two) times daily with a meal.  ?  Dispense:  180 tablet  ?  Refill:  0  ?  Courtesy Refill. Needs OV and Labs before further fills.  ?  Order Specific Question:   Supervising Provider  ?  Answer:   , JEFFREY R [[id number]]  ? metoprolol tartrate (LOPRESSOR) 50 MG tablet  ?  Sig: Take 1 tablet (50 mg total) by mouth 2 (two) times daily.  ?  Dispense:  180 tablet  ?  Refill:  0  ?  Courtesy Refill. Needs OV and Labs before further fills.  ?  Order Specific Question:   Supervising Provider  ?  Answer:   Neva Seat, JEFFREY R [[id number]]  ? rosuvastatin (CRESTOR) 5 MG tablet  ?  Sig: Take 1 tablet (5 mg total) by mouth daily.  ?  Dispense:  90 tablet  ?  Refill:  0  ?  Courtesy Refill.  Needs OV and Labs before further fills.  ?  Order Specific Question:   Supervising Provider  ?  Answer:   Neva Seat, JEFFREY R [[id number]]  ? FLUoxetine (PROZAC) 40 MG capsule  ?  Sig: Take 1 capsule (40 mg total) by mouth daily.  ?  Dispense:  90 capsule  ?  Refill:  0  ?  Courtesy Refill. Needs OV and Labs before further fills.  ?  Order Specific Question:   Supervising Provider  ?  Answer:   Neva Seat, JEFFREY R [[id number]]  ? losartan-hydrochlorothiazide (HYZAAR) 100-12.5 MG tablet  ?  Sig: Take 1 tablet by mouth daily.  ?  Dispense:  90 tablet  ?  Refill:  0  ?  Courtesy Refill. Needs OV and Labs before further fills.  ?  Order Specific Question:   Supervising Provider  ?  Answer:   Neva Seat, JEFFREY R [[id number]]  ? nirmatrelvir/ritonavir EUA (PAXLOVID) 20 x 150 MG & 10 x 100MG  TABS  ?  Sig: Take 3 tablets by mouth 2 (two) times daily for 5 days. (Take nirmatrelvir 150 mg two tablets twice daily for 5 days and ritonavir 100 mg one tablet twice daily for 5 days) Patient GFR is >60  ?  Dispense:  30 tablet  ?  Refill:  0  ?  Order Specific Question:   Supervising Provider  ?  Answer:   , JEFFREY R [[id number]]  ? ? ?Return in about 3 months (around 10/16/2021), or 2 weeks for nurse visit, bp check, for Chronic Conditions. ? ?PLAN ?See problem based charting for chronic considerations. ?Hold rosuvastatin through COVID ?Paxlovid sent after disussing r/b/se. Can use OTC cough and cold relief. ?Return if worsening or failing to improve ?Patient encouraged to call clinic with any questions, comments, or concerns. ? ?10/18/2021, NP ?

## 2021-07-16 NOTE — Assessment & Plan Note (Signed)
Continue current meds 

## 2021-07-16 NOTE — Assessment & Plan Note (Signed)
No ae on current meds. Recheck a1c after covid. rechcek in 3 mo ?

## 2021-07-16 NOTE — Patient Instructions (Signed)
Mr Cameron Wells -  ? ?Great to see you ? ?Labs and BP check in 2 weeks or so ? ?Paxlovid as prescribed. Hold rosuvastatin while taking this. ? Ok to use mucinex or other OTC cough/cold relief. ? ?Call if not better or any worse by Friday ? ?See you in 3 mo ? ?Thanks ? ?Cameron Wells  ?

## 2021-07-16 NOTE — Assessment & Plan Note (Signed)
Improved since last visit. Will recheck in 2 weeks for nurse visit after acute illness resolved.  ?

## 2021-07-30 ENCOUNTER — Other Ambulatory Visit: Payer: Self-pay

## 2021-07-30 ENCOUNTER — Ambulatory Visit: Payer: Self-pay

## 2021-08-22 ENCOUNTER — Ambulatory Visit (INDEPENDENT_AMBULATORY_CARE_PROVIDER_SITE_OTHER): Payer: Self-pay | Admitting: Registered Nurse

## 2021-08-22 VITALS — BP 176/92

## 2021-08-22 DIAGNOSIS — I10 Essential (primary) hypertension: Secondary | ICD-10-CM

## 2021-08-22 MED ORDER — METOPROLOL SUCCINATE ER 100 MG PO TB24: 100.0000 mg | ORAL_TABLET | Freq: Every day | ORAL | 3 refills | Status: DC

## 2021-08-22 MED ORDER — LOSARTAN POTASSIUM-HCTZ 100-25 MG PO TABS
1.0000 | ORAL_TABLET | Freq: Every day | ORAL | 1 refills | Status: DC
Start: 2021-08-22 — End: 2021-11-20

## 2021-08-22 NOTE — Progress Notes (Signed)
Pt presents today for BP check, noted has not taken medication this morning yet, BP is out of range at 184/100 provider Janeece Agee informed

## 2021-09-18 ENCOUNTER — Other Ambulatory Visit: Payer: Self-pay | Admitting: Registered Nurse

## 2021-09-18 DIAGNOSIS — Z889 Allergy status to unspecified drugs, medicaments and biological substances status: Secondary | ICD-10-CM

## 2021-10-16 ENCOUNTER — Ambulatory Visit: Payer: Self-pay | Admitting: Registered Nurse

## 2021-11-06 ENCOUNTER — Ambulatory Visit: Payer: Self-pay | Admitting: Registered Nurse

## 2021-11-20 ENCOUNTER — Encounter: Payer: Self-pay | Admitting: Registered Nurse

## 2021-11-20 ENCOUNTER — Ambulatory Visit (INDEPENDENT_AMBULATORY_CARE_PROVIDER_SITE_OTHER): Payer: Self-pay | Admitting: Registered Nurse

## 2021-11-20 ENCOUNTER — Other Ambulatory Visit: Payer: Self-pay

## 2021-11-20 VITALS — BP 172/100 | HR 67 | Temp 98.0°F | Resp 17 | Ht 71.0 in | Wt 225.2 lb

## 2021-11-20 DIAGNOSIS — E785 Hyperlipidemia, unspecified: Secondary | ICD-10-CM

## 2021-11-20 DIAGNOSIS — E119 Type 2 diabetes mellitus without complications: Secondary | ICD-10-CM

## 2021-11-20 DIAGNOSIS — I10 Essential (primary) hypertension: Secondary | ICD-10-CM

## 2021-11-20 DIAGNOSIS — F4323 Adjustment disorder with mixed anxiety and depressed mood: Secondary | ICD-10-CM

## 2021-11-20 LAB — CBC WITH DIFFERENTIAL/PLATELET
Basophils Absolute: 0.1 10*3/uL (ref 0.0–0.1)
Basophils Relative: 0.8 % (ref 0.0–3.0)
Eosinophils Absolute: 0.4 10*3/uL (ref 0.0–0.7)
Eosinophils Relative: 6.2 % — ABNORMAL HIGH (ref 0.0–5.0)
HCT: 40.8 % (ref 39.0–52.0)
Hemoglobin: 13.7 g/dL (ref 13.0–17.0)
Lymphocytes Relative: 30.8 % (ref 12.0–46.0)
Lymphs Abs: 2 10*3/uL (ref 0.7–4.0)
MCHC: 33.6 g/dL (ref 30.0–36.0)
MCV: 91.8 fl (ref 78.0–100.0)
Monocytes Absolute: 0.4 10*3/uL (ref 0.1–1.0)
Monocytes Relative: 6 % (ref 3.0–12.0)
Neutro Abs: 3.6 10*3/uL (ref 1.4–7.7)
Neutrophils Relative %: 56.2 % (ref 43.0–77.0)
Platelets: 178 10*3/uL (ref 150.0–400.0)
RBC: 4.45 Mil/uL (ref 4.22–5.81)
RDW: 13.7 % (ref 11.5–15.5)
WBC: 6.5 10*3/uL (ref 4.0–10.5)

## 2021-11-20 LAB — COMPREHENSIVE METABOLIC PANEL
ALT: 24 U/L (ref 0–53)
AST: 25 U/L (ref 0–37)
Albumin: 4.6 g/dL (ref 3.5–5.2)
Alkaline Phosphatase: 41 U/L (ref 39–117)
BUN: 16 mg/dL (ref 6–23)
CO2: 26 mEq/L (ref 19–32)
Calcium: 9.6 mg/dL (ref 8.4–10.5)
Chloride: 101 mEq/L (ref 96–112)
Creatinine, Ser: 1.47 mg/dL (ref 0.40–1.50)
GFR: 54.62 mL/min — ABNORMAL LOW (ref >60.00)
Glucose, Bld: 131 mg/dL — ABNORMAL HIGH (ref 70–99)
Potassium: 3.4 mEq/L — ABNORMAL LOW (ref 3.5–5.1)
Sodium: 138 mEq/L (ref 135–145)
Total Bilirubin: 0.6 mg/dL (ref 0.2–1.2)
Total Protein: 7.6 g/dL (ref 6.0–8.3)

## 2021-11-20 LAB — POCT GLYCOSYLATED HEMOGLOBIN (HGB A1C): Hemoglobin A1C: 6.5 % — AB (ref 4.0–5.6)

## 2021-11-20 LAB — LDL CHOLESTEROL, DIRECT: Direct LDL: 145 mg/dL

## 2021-11-20 LAB — LIPID PANEL
Cholesterol: 274 mg/dL — ABNORMAL HIGH (ref 0–200)
HDL: 44.8 mg/dL (ref >39.00)
NonHDL: 229.6
Total CHOL/HDL Ratio: 6
Triglycerides: 329 mg/dL — ABNORMAL HIGH (ref 0.0–149.0)
VLDL: 65.8 mg/dL — ABNORMAL HIGH (ref 0.0–40.0)

## 2021-11-20 LAB — MICROALBUMIN / CREATININE URINE RATIO
Creatinine,U: 131.5 mg/dL
Microalb Creat Ratio: 9.1 mg/g (ref 0.0–30.0)
Microalb, Ur: 12 mg/dL — ABNORMAL HIGH (ref 0.0–1.9)

## 2021-11-20 LAB — HEMOGLOBIN A1C: Hgb A1c MFr Bld: 6.9 % — ABNORMAL HIGH (ref 4.6–6.5)

## 2021-11-20 LAB — TSH: TSH: 1.01 u[IU]/mL (ref 0.35–5.50)

## 2021-11-20 MED ORDER — METFORMIN HCL 500 MG PO TABS: 500.0000 mg | ORAL_TABLET | Freq: Two times a day (BID) | ORAL | 1 refills | Status: DC

## 2021-11-20 MED ORDER — ROSUVASTATIN CALCIUM 5 MG PO TABS: 5.0000 mg | ORAL_TABLET | Freq: Every day | ORAL | 1 refills | Status: DC

## 2021-11-20 MED ORDER — LOSARTAN POTASSIUM-HCTZ 100-25 MG PO TABS: 1.0000 | ORAL_TABLET | Freq: Every day | ORAL | 1 refills | Status: DC

## 2021-11-20 MED ORDER — FLUOXETINE HCL 40 MG PO CAPS: 40.0000 mg | ORAL_CAPSULE | Freq: Every day | ORAL | 3 refills | Status: DC

## 2021-11-20 MED ORDER — METOPROLOL SUCCINATE ER 100 MG PO TB24: 100.0000 mg | ORAL_TABLET | Freq: Every day | ORAL | 3 refills | Status: DC

## 2021-11-20 NOTE — Patient Instructions (Signed)
[name] -  [name] to see you  I have placed a referral to the hypertension clinic - they will call you!  I recommend these PCPs:  Jarold Motto, [place] Jacquiline Doe, MD Edwina Barth, MD Letta Moynahan Early, NP Jiles Prows, DNP Glenetta Hew, MD Abbe Amsterdam, MD  Keep on current meds  Good work on the sugars  Thank you for letting me take part in your care,  [name]

## 2021-11-20 NOTE — Progress Notes (Signed)
Established Patient Office Visit  Subjective:  Patient ID: Cameron Wells, male    DOB: 13-May-1970  Age: 51 y.o. MRN: [id number]  CC:  Chief Complaint  Patient presents with   Follow-up    Patient states he is here for a follow up on diabetes and blood pressure.    HPI Bhavin Halsted presents for htn,, t2dm  Hypertension: Patient Currently taking: losartan-hctz 100-25mg  po qd, metoprolol 100mg  XL po qd Good effect. No AEs. Denies CV symptoms including: chest pain, shob, doe, headache, visual changes, fatigue, claudication, and dependent edema.   Previous readings and labs: BP Readings from Last 3 Encounters:  11/20/21 (!) 172/100  08/22/21 (!) 176/92  07/16/21 (!) 158/78   Lab Results  Component Value Date   CREATININE 1.27 12/16/2019     T2dm Last A1c:  Lab Results  Component Value Date   HGBA1C 6.5 (A) 11/20/2021    Currently taking: metformin 500mg  po bid ac No new complications Reports good compliance with medications Diet has been steady Exercise habits have been steady.   Outpatient Medications Prior to Visit  Medication Sig Dispense Refill   cetirizine (ZYRTEC) 10 MG tablet TAKE 1 TABLET BY MOUTH ONCE DAILY ** COURTESY REFILL. NEEDS OFFICE VISIT AND LABS BEFORE FURTHER REFILLS** 30 tablet 11   EPINEPHrine 0.3 mg/0.3 mL IJ SOAJ injection Inject 0.3 mg into the muscle as needed for anaphylaxis. 1 each 0   fluticasone (FLONASE) 50 MCG/ACT nasal spray Use 2 spray(s) in each nostril once daily 16 g 0   omeprazole (PRILOSEC OTC) 20 MG tablet Take 20 mg by mouth daily.     FLUoxetine (PROZAC) 40 MG capsule Take 1 capsule (40 mg total) by mouth daily. 90 capsule 0   losartan-hydrochlorothiazide (HYZAAR) 100-25 MG tablet Take 1 tablet by mouth daily. 90 tablet 1   metFORMIN (GLUCOPHAGE) 500 MG tablet Take 1 tablet (500 mg total) by mouth 2 (two) times daily with a meal. 180 tablet 0   metoprolol succinate (TOPROL-XL) 100 MG 24 hr tablet Take 1 tablet (100 mg total) by  mouth daily. Take with or immediately following a meal. 90 tablet 3   rosuvastatin (CRESTOR) 5 MG tablet Take 1 tablet (5 mg total) by mouth daily. 90 tablet 0   No facility-administered medications prior to visit.    Review of Systems  Constitutional: Negative.   HENT: Negative.    Eyes: Negative.   Respiratory: Negative.    Cardiovascular: Negative.   Gastrointestinal: Negative.   Genitourinary: Negative.   Musculoskeletal: Negative.   Skin: Negative.   Neurological: Negative.   Psychiatric/Behavioral: Negative.        Objective:     BP (!) 172/100   Pulse 67   Temp 98 F (36.7 C) (Temporal)   Resp 17   Ht 5\' 11"  (1.803 m)   Wt 225 lb 3.2 oz (102.2 kg)   SpO2 99%   BMI 31.41 kg/m   Wt Readings from Last 3 Encounters:  11/20/21 225 lb 3.2 oz (102.2 kg)  07/16/21 209 lb 6.4 oz (95 kg)  03/13/21 200 lb (90.7 kg)   Physical Exam Constitutional:      General: He is not in acute distress.    Appearance: Normal appearance. He is normal weight. He is not ill-appearing, toxic-appearing or diaphoretic.  Cardiovascular:     Rate and Rhythm: Normal rate and regular rhythm.     Heart sounds: Normal heart sounds. No murmur heard.    No friction rub. No gallop.  Pulmonary:     Effort: Pulmonary effort is normal. No respiratory distress.     Breath sounds: Normal breath sounds. No stridor. No wheezing, rhonchi or rales.  Chest:     Chest wall: No tenderness.  Neurological:     General: No focal deficit present.     Mental Status: He is alert and oriented to person, place, and time. Mental status is at baseline.  Psychiatric:        Mood and Affect: Mood normal.        Behavior: Behavior normal.        Thought Content: Thought content normal.        Judgment: Judgment normal.     Results for orders placed or performed in visit on 11/20/21  POCT glycosylated hemoglobin (Hb A1C)  Result Value Ref Range   Hemoglobin A1C 6.5 (A) 4.0 - 5.6 %   HbA1c POC (<> result,  manual entry)     HbA1c, POC (prediabetic range)     HbA1c, POC (controlled diabetic range)        The 10-year ASCVD risk score (Arnett DK, et al., 2019) is: 33.2%    Assessment & Plan:   Problem List Items Addressed This Visit       Cardiovascular and Mediastinum   Uncontrolled hypertension - Primary    Refer to advanced hypertension clinic      Relevant Medications   losartan-hydrochlorothiazide (HYZAAR) 100-25 MG tablet   metoprolol succinate (TOPROL-XL) 100 MG 24 hr tablet   rosuvastatin (CRESTOR) 5 MG tablet   Other Relevant Orders   Ambulatory referral to [facility]   CBC with Differential/Platelet   Comprehensive metabolic panel   Hemoglobin A1c   Lipid panel   TSH   Microalbumin / creatinine urine ratio     Endocrine   Newly diagnosed diabetes (HCC)    A1c improved ot 6.5. continue current therapy      Relevant Medications   losartan-hydrochlorothiazide (HYZAAR) 100-25 MG tablet   metFORMIN (GLUCOPHAGE) 500 MG tablet   rosuvastatin (CRESTOR) 5 MG tablet   Other Relevant Orders   POCT glycosylated hemoglobin (Hb A1C) (Completed)   CBC with Differential/Platelet   Comprehensive metabolic panel   Hemoglobin A1c   Lipid panel   TSH   Microalbumin / creatinine urine ratio     Other   Adjustment disorder with mixed anxiety and depressed mood    Continue current regimen. Well controlled. No hi/si      Relevant Medications   FLUoxetine (PROZAC) 40 MG capsule   Dyslipidemia    Labs collected. Will follow up with the patient as warranted.       Relevant Medications   rosuvastatin (CRESTOR) 5 MG tablet   Other Relevant Orders   CBC with Differential/Platelet   Comprehensive metabolic panel   Hemoglobin A1c   Lipid panel   TSH   Microalbumin / creatinine urine ratio    Meds ordered this encounter  Medications   FLUoxetine (PROZAC) 40 MG capsule    Sig: Take 1 capsule (40 mg total) by mouth daily.    Dispense:   90 capsule    Refill:  3    Order Specific Question:   Supervising Provider    Answer:   Neva Seat, JEFFREY R [[id number]]   losartan-hydrochlorothiazide (HYZAAR) 100-25 MG tablet    Sig: Take 1 tablet by mouth daily.    Dispense:  90 tablet    Refill:  1    Order Specific  Question:   Supervising Provider    Answer:   Neva Seat, JEFFREY R [[id number]]   metFORMIN (GLUCOPHAGE) 500 MG tablet    Sig: Take 1 tablet (500 mg total) by mouth 2 (two) times daily with a meal.    Dispense:  180 tablet    Refill:  1    Order Specific Question:   Supervising Provider    Answer:   Neva Seat, JEFFREY R [[id number]]   metoprolol succinate (TOPROL-XL) 100 MG 24 hr tablet    Sig: Take 1 tablet (100 mg total) by mouth daily. Take with or immediately following a meal.    Dispense:  90 tablet    Refill:  3    Order Specific Question:   Supervising Provider    Answer:   Neva Seat, JEFFREY R [[id number]]   rosuvastatin (CRESTOR) 5 MG tablet    Sig: Take 1 tablet (5 mg total) by mouth daily.    Dispense:  90 tablet    Refill:  1    Courtesy Refill. Needs OV and Labs before further fills.    Order Specific Question:   Supervising Provider    Answer:   Neva Seat, JEFFREY R [[id number]]    Return if symptoms worsen or fail to improve.    Janeece Agee, NP

## 2021-11-20 NOTE — Assessment & Plan Note (Signed)
A1c improved ot 6.5. continue current therapy

## 2021-11-20 NOTE — Assessment & Plan Note (Signed)
Continue current regimen. Well controlled. No hi/si

## 2021-11-20 NOTE — Assessment & Plan Note (Signed)
Labs collected. Will follow up with the patient as warranted.  

## 2021-11-20 NOTE — Assessment & Plan Note (Signed)
Refer to advanced hypertension clinic 

## 2022-01-21 NOTE — Progress Notes (Signed)
Advanced Hypertension Clinic Initial Assessment:    Date:  01/24/2022   ID:  Cameron Wells, DOB 06-27-1970, MRN [id number]  PCP:  Maximiano Coss, NP  Cardiologist:  None  Nephrologist:  Referring MD: Maximiano Coss, NP   CC: Hypertension  History of Present Illness:    Cameron Wells is a 51 y.o. male with a hx of HTN, HLD, DM2, anxiety/depression here to establish care in the Advanced Hypertension Clinic.   Saw primary care 11/20/21 noted uncontrolled blood pressure 172/100 despite Losartan-HCTZ 100-25mg  QD, Toprol 100mg  QD. Referred to hypertension clinic.   Works in [employer]	[place]. 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 Lives at home with his wife and two children. Cameron Wells was diagnosed with hypertension in his early 57s (PCP notes back to 2011 noting elevated blood pressure). It has been difficult to control. Blood pressure checked with arm cuff at home. He is not checking routinely. he reports former tobacco use having smoked from age 31 until 2011. Alcohol use 3-4 times per week with 3 drinks. For exercise he does not have a formal routine. he eats at home and outside of the home. Reports he uses CPAP regularly.   Reports no shortness of breath nor dyspnea on exertion. Reports no chest pain, pressure, or tightness. No edema, orthopnea, PND. Reports no palpitations.    Previous antihypertensives:  Past Medical History:  Diagnosis Date   Allergy    Anxiety    Asthma    DM (diabetes mellitus) ([place])    Hyperlipidemia    Hypertension    Panic attack     History reviewed. No pertinent surgical history.  Current Medications: Current Meds  Medication Sig   carvedilol (COREG) 25 MG tablet Take 1 tablet (25 mg total) by mouth 2 (two) times daily.   cetirizine (ZYRTEC) 10 MG tablet TAKE 1 TABLET BY MOUTH ONCE DAILY ** COURTESY REFILL. NEEDS OFFICE VISIT AND LABS BEFORE FURTHER REFILLS**   EPINEPHrine 0.3 mg/0.3 mL IJ SOAJ injection Inject 0.3 mg into  the muscle as needed for anaphylaxis.   FLUoxetine (PROZAC) 40 MG capsule Take 1 capsule (40 mg total) by mouth daily.   fluticasone (FLONASE) 50 MCG/ACT nasal spray Use 2 spray(s) in each nostril once daily   metFORMIN (GLUCOPHAGE) 500 MG tablet Take 1 tablet (500 mg total) by mouth 2 (two) times daily with a meal.   omeprazole (PRILOSEC OTC) 20 MG tablet Take 20 mg by mouth daily.   rosuvastatin (CRESTOR) 5 MG tablet Take 1 tablet (5 mg total) by mouth daily.   valsartan-hydrochlorothiazide (DIOVAN-HCT) 320-25 MG tablet Take 1 tablet by mouth daily.   [DISCONTINUED] losartan-hydrochlorothiazide (HYZAAR) 100-25 MG tablet Take 1 tablet by mouth daily.   [DISCONTINUED] metoprolol succinate (TOPROL-XL) 100 MG 24 hr tablet Take 1 tablet (100 mg total) by mouth daily. Take with or immediately following a meal.     Allergies:   Penicillins   Social History   Socioeconomic History   Marital status: Married    Spouse name: Not on file   Number of children: Not on file   Years of education: Not on file   Highest education level: Not on file  Occupational History   Not on file  Tobacco Use   Smoking status: Former    Types: Cigarettes    Quit date: 2011    Years since quitting: 12.7   Smokeless tobacco: Never  Vaping Use   Vaping Use: Never used  Substance and Sexual Activity  Alcohol use: Yes    Alcohol/week: 0.0 standard drinks of alcohol   Drug use: No   Sexual activity: Not on file  Other Topics Concern   Not on file  Social History Narrative   Not on file   Social Determinants of Health   Financial Resource Strain: Low Risk  (01/23/2022)   Overall Financial Resource Strain (CARDIA)    Difficulty of Paying Living Expenses: Not very hard  Food Insecurity: No Food Insecurity (01/23/2022)   Hunger Vital Sign    Worried About Running Out of Food in the Last Year: Never true    Ran Out of Food in the Last Year: Never true  Transportation Needs: No Transportation Needs  (01/23/2022)   PRAPARE - Administrator, Civil Service (Medical): No    Lack of Transportation (Non-Medical): No  Physical Activity: Insufficiently Active (01/23/2022)   Exercise Vital Sign    Days of Exercise per Week: 2 days    Minutes of Exercise per Session: 30 min  Stress: No Stress Concern Present (01/23/2022)   [employer] of Occupational Health - Occupational Stress Questionnaire    Feeling of Stress : Only a little  Social Connections: Socially Isolated (01/23/2022)   Social Connection and Isolation Panel [NHANES]    Frequency of Communication with Friends and Family: Twice a week    Frequency of Social Gatherings with Friends and Family: Never    Attends Religious Services: Never    Database administrator or Organizations: No    Attends Engineer, structural: Never    Marital Status: Married     Family History: The patient's family history includes CAD in his maternal grandmother; Cancer in his paternal grandfather; Hypertension in his maternal aunt.  ROS:   Please see the history of present illness.     All other systems reviewed and are negative.  EKGs/Labs/Other Studies Reviewed:    EKG:  EKG is ordered today.  The ekg ordered today demonstrates NSR 76 bpm with T wave inversion V4-V6 suggestive of lateral ischemia.  Recent Labs: 11/20/2021: ALT 24; BUN 16; Creatinine, Ser 1.47; Hemoglobin 13.7; Platelets 178.0; Potassium 3.4; Sodium 138; TSH 1.01   Recent Lipid Panel    Component Value Date/Time   CHOL 274 (H) 11/20/2021 1136   CHOL 239 (H) 12/16/2019 1432   TRIG 329.0 (H) 11/20/2021 1136   HDL 44.80 11/20/2021 1136   HDL 34 (L) 12/16/2019 1432   CHOLHDL 6 11/20/2021 1136   VLDL 65.8 (H) 11/20/2021 1136   LDLCALC 130 (H) 12/16/2019 1432   LDLDIRECT 145.0 11/20/2021 1136    Physical Exam:   VS:  BP (!) 199/123 Comment: right arm  Pulse 76   Ht 5\' 11"  (1.803 m)   Wt 224 lb (101.6 kg)   BMI 31.24 kg/m  , BMI Body mass index is 31.24  kg/m. GENERAL:  Well appearing HEENT: Pupils equal round and reactive, fundi not visualized, oral mucosa unremarkable NECK:  No jugular venous distention, waveform within normal limits, carotid upstroke brisk and symmetric, no bruits, no thyromegaly LYMPHATICS:  No cervical adenopathy LUNGS:  Clear to auscultation bilaterally HEART:  RRR.  PMI not displaced or sustained,S1 and S2 within normal limits, no S3, no S4, no clicks, no rubs, no murmurs ABD:  Flat, positive bowel sounds normal in frequency in pitch, no bruits, no rebound, no guarding, no midline pulsatile mass, no hepatomegaly, no splenomegaly EXT:  2 plus pulses throughout, no edema, no cyanosis no clubbing SKIN:  No rashes no nodules NEURO:  Cranial nerves II through XII grossly intact, motor grossly intact throughout PSYCH:  Cognitively intact, oriented to person place and time   ASSESSMENT/PLAN:    HTN - BP not at goal < 130/80. STOP Losartan-HCTZ. START Valsartan-HCTZ 320-25mg  QD. STOP Metoprolol. START Carvedilol 25mg  BID.  11/20/21 normal thyroid.  Labs in one week: BMP, catecholamines, metanephrines Reports compliance with CPAP.  BP symmetric, no carotid duplex needed.  Renal artery duplex ordered. Will reach out to billing team for cost estimate as he does not have insurance.  Referred to PREP exercise program at [facility].  Heart healthy diet and regular cardiovascular exercise encouraged.    Abnormal EKG - TWI lateral leads. No anginal symptoms. Coronary calcium score to assess for CAD. Will defer cardiac CTA as he does not have insurance at this time. Referred to SW due to lack of insurance.   HLD - 11/20/21 LDL 145. Continue Crestor. Due for repeat lipid but will defer today as not fasting. Pending result of coronary calcium score may require more aggressive therapy.   DM2 - 11/20/21 A1c 6.9. Continue to follow with PCP.   CKD 3a - based on most recent labs 11/20/21. Careful titration of diuretic and antihypertensive.  BMP in  1 week.  Depression/Anxiety - Continue to follow with PCP.   Screening for Secondary Hypertension:     Relevant Labs/Studies:    Latest Ref Rng & Units 11/20/2021   11:36 AM 12/16/2019    2:32 PM 04/27/2019    2:59 PM  Basic Labs  Sodium 135 - 145 mEq/L 138  144  141   Potassium 3.5 - 5.1 mEq/L 3.4  4.3  4.4   Creatinine 0.40 - 1.50 mg/dL 06/25/2019  6.96  2.95        Latest Ref Rng & Units 11/20/2021   11:36 AM 12/16/2019    2:32 PM  Thyroid   TSH 0.35 - 5.50 uIU/mL 1.01  0.664        Latest Ref Rng & Units 04/27/2019    2:59 PM  Renin/Aldosterone   Aldosterone 0.0 - 30.0 ng/dL 06/25/2019   Renin 13.2 - 4.401 ng/mL/hr 0.236  C  Aldos/Renin Ratio 0.0 - 30.0 97.0  C    C Corrected result             01/23/2022   11:30 AM  Renovascular   Renal Artery 03/25/2022 Completed Yes     Disposition:    FU with MD/PharmD in 3 months    Medication Adjustments/Labs and Tests Ordered: Current medicines are reviewed at length with the patient today.  Concerns regarding medicines are outlined above.  Orders Placed This Encounter  Procedures   CT CARDIAC SCORING (SELF PAY ONLY)   Basic metabolic panel   Metanephrines, plasma   Catecholamines, fractionated, plasma   Cortisol   Amb Referral To Provider Referral Exercise Program (P.R.E.P)   Referral to HRT/VAS Care Navigation   EKG 12-Lead   VAS US RENAL ARTERY DUPLEX   Meds ordered this encounter  Medications   valsartan-hydrochlorothiazide (DIOVAN-HCT) 320-25 MG tablet    Sig: Take 1 tablet by mouth daily.    Dispense:  90 tablet    Refill:  3   carvedilol (COREG) 25 MG tablet    Sig: Take 1 tablet (25 mg total) by mouth 2 (two) times daily.    Dispense:  180 tablet    Refill:  3   Signed, [place], NP  01/24/2022 10:55 AM  [facility]

## 2022-01-23 ENCOUNTER — Ambulatory Visit (INDEPENDENT_AMBULATORY_CARE_PROVIDER_SITE_OTHER): Payer: Self-pay | Admitting: Family

## 2022-01-23 ENCOUNTER — Encounter (HOSPITAL_BASED_OUTPATIENT_CLINIC_OR_DEPARTMENT_OTHER): Payer: Self-pay | Admitting: Family

## 2022-01-23 ENCOUNTER — Telehealth: Payer: Self-pay | Admitting: Licensed Clinical Social Worker

## 2022-01-23 VITALS — BP 199/123 | HR 76 | Ht 71.0 in | Wt 224.0 lb

## 2022-01-23 DIAGNOSIS — N1831 Chronic kidney disease, stage 3a: Secondary | ICD-10-CM

## 2022-01-23 DIAGNOSIS — I10 Essential (primary) hypertension: Secondary | ICD-10-CM

## 2022-01-23 DIAGNOSIS — R9431 Abnormal electrocardiogram [ECG] [EKG]: Secondary | ICD-10-CM

## 2022-01-23 DIAGNOSIS — E782 Mixed hyperlipidemia: Secondary | ICD-10-CM

## 2022-01-23 MED ORDER — VALSARTAN-HYDROCHLOROTHIAZIDE 320-25 MG PO TABS: 1.0000 | ORAL_TABLET | Freq: Every day | ORAL | 3 refills | Status: DC

## 2022-01-23 MED ORDER — CARVEDILOL 25 MG PO TABS: 25.0000 mg | ORAL_TABLET | Freq: Two times a day (BID) | ORAL | 3 refills | Status: DC

## 2022-01-23 NOTE — Telephone Encounter (Signed)
H&V Care Navigation CSW Progress Note  Clinical Social Worker contacted patient by phone to f/u on referral to care navigation. Pt uninsured and his PCP has left the practice and is in need of a new one. No answer despite two attempts to [phone]. No voicemail set up. Will re-attempt as able.   Patient is participating in a Managed Medicaid Plan:  No, self pay.   SDOH Screenings   Food Insecurity: No Food Insecurity (01/23/2022)  Housing: Low Risk  (01/23/2022)  Transportation Needs: No Transportation Needs (01/23/2022)  Utilities: Not At Risk (01/23/2022)  Alcohol Screen: Low Risk  (01/23/2022)  Depression (PHQ2-9): Low Risk  (11/20/2021)  Financial Resource Strain: Low Risk  (01/23/2022)  Physical Activity: Insufficiently Active (01/23/2022)  Social Connections: Socially Isolated (01/23/2022)  Stress: No Stress Concern Present (01/23/2022)  Tobacco Use: Medium Risk (01/23/2022)    Westley Hummer, MSW, LCSW Clinical Social Worker II [place]  [phone]- work cell phone (preferred) [phone]- desk phone

## 2022-01-23 NOTE — Patient Instructions (Signed)
Medication Instructions:  Your physician has recommended you make the following change in your medication:   Stop: Losartan- HCTZ   Start: Valsartan- Hydrochlorothiazide 320-25mg  daily   Stop: Metoprolol   Start: Carvedilol 25mg  twice daily    Labwork: Please return for Lab work fasting in one week for BMP, Catecholamines, Metanephrines and cortisol. You may come to the...   [facility] (3rd floor) [address] [place], [place]  Open: 8am-Noon and 1pm-4:30pm  Please ring the doorbell on the small table when you exit the elevator and the Lab Tech will come get you  [facility] at [facility] [address], [place], [place] Open: 8am-1pm, then 2pm-4:30pm   [facility]- Please see attached locations sheet stapled to your lab work with address and hours.     Testing/Procedures: Your physician has recommend you to have a coronary calcium score. This is a self pay test that will cost $99  Your physician has requested that you have a renal artery duplex, you do not have to schedule this one today- we will get you a price estimate first. During this test, an ultrasound is used to evaluate blood flow to the kidneys. Allow one hour for this exam. Do not eat after midnight the day before and avoid carbonated beverages. Take your medications as you usually do.;   Follow-Up: Follow up in 3-4 months with Dr. [id number], Duke Salvia, NP or PharmD in HTN Clinic     Referrals:  We have referred you to PREP today. They will reach out to get you started.    Special Instructions:  DASH Eating Plan DASH stands for Dietary Approaches to Stop Hypertension. The DASH eating plan is a healthy eating plan that has been shown to: Reduce high blood pressure (hypertension). Reduce your risk for type 2 diabetes, heart disease, and stroke. Help with weight loss. What are tips for following this plan? Reading food labels Check food  labels for the amount of salt (sodium) per serving. Choose foods with less than 5 percent of the Daily Value of sodium. Generally, foods with less than 300 milligrams (mg) of sodium per serving fit into this eating plan. To find whole grains, look for the word "whole" as the first word in the ingredient list. Shopping Buy products labeled as "low-sodium" or "no salt added." Buy fresh foods. Avoid canned foods and pre-made or frozen meals. Cooking Avoid adding salt when cooking. Use salt-free seasonings or herbs instead of table salt or sea salt. Check with your health care provider or pharmacist before using salt substitutes. Do not fry foods. Cook foods using healthy methods such as baking, boiling, grilling, roasting, and broiling instead. Cook with heart-healthy oils, such as olive, canola, avocado, soybean, or sunflower oil. Meal planning  Eat a balanced diet that includes: 4 or more servings of fruits and 4 or more servings of vegetables each day. Try to fill one-half of your plate with fruits and vegetables. 6-8 servings of whole grains each day. Less than 6 oz (170 g) of lean meat, poultry, or fish each day. A 3-oz (85-g) serving of meat is about the same size as a deck of cards. One egg equals 1 oz (28 g). 2-3 servings of low-fat dairy each day. One serving is 1 cup (237 mL). 1 serving of nuts, seeds, or beans 5 times each week. 2-3 servings of heart-healthy fats. Healthy fats called omega-3 fatty acids are found in foods such as walnuts, flaxseeds, fortified  milks, and eggs. These fats are also found in cold-water fish, such as sardines, salmon, and mackerel. Limit how much you eat of: Canned or prepackaged foods. Food that is high in trans fat, such as some fried foods. Food that is high in saturated fat, such as fatty meat. Desserts and other sweets, sugary drinks, and other foods with added sugar. Full-fat dairy products. Do not salt foods before eating. Do not eat more than 4  egg yolks a week. Try to eat at least 2 vegetarian meals a week. Eat more home-cooked food and less restaurant, buffet, and fast food. Lifestyle When eating at a restaurant, ask that your food be prepared with less salt or no salt, if possible. If you drink alcohol: Limit how much you use to: 0-1 drink a day for women who are not pregnant. 0-2 drinks a day for men. Be aware of how much alcohol is in your drink. In the [place] one drink equals one 12 oz bottle of beer (355 mL), one 5 oz glass of wine (148 mL), or one 1 oz glass of hard liquor (44 mL). General information Avoid eating more than 2,300 mg of salt a day. If you have hypertension, you may need to reduce your sodium intake to 1,500 mg a day. Work with your health care provider to maintain a healthy body weight or to lose weight. Ask what an ideal weight is for you. Get at least 30 minutes of exercise that causes your heart to beat faster (aerobic exercise) most days of the week. Activities may include walking, swimming, or biking. Work with your health care provider or dietitian to adjust your eating plan to your individual calorie needs. What foods should I eat? Fruits All fresh, dried, or frozen fruit. Canned fruit in natural juice (without added sugar). Vegetables Fresh or frozen vegetables (raw, steamed, roasted, or grilled). Low-sodium or reduced-sodium tomato and vegetable juice. Low-sodium or reduced-sodium tomato sauce and tomato paste. Low-sodium or reduced-sodium canned vegetables. Grains Whole-grain or whole-wheat bread. Whole-grain or whole-wheat pasta. Brown rice. [place] Morrow. Bulgur. Whole-grain and low-sodium cereals. Pita bread. Low-fat, low-sodium crackers. Whole-wheat flour tortillas. Meats and other proteins Skinless chicken or [place]. Ground chicken or [place]. Pork with fat trimmed off. Fish and seafood. Egg whites. Dried beans, peas, or lentils. Unsalted nuts, nut butters, and seeds. Unsalted canned beans.  Lean cuts of beef with fat trimmed off. Low-sodium, lean precooked or cured meat, such as sausages or meat loaves. Dairy Low-fat (1%) or fat-free (skim) milk. Reduced-fat, low-fat, or fat-free cheeses. Nonfat, low-sodium ricotta or cottage cheese. Low-fat or nonfat yogurt. Low-fat, low-sodium cheese. Fats and oils Soft margarine without trans fats. Vegetable oil. Reduced-fat, low-fat, or light mayonnaise and salad dressings (reduced-sodium). Canola, safflower, olive, avocado, soybean, and sunflower oils. Avocado. Seasonings and condiments Herbs. Spices. Seasoning mixes without salt. Other foods Unsalted popcorn and pretzels. Fat-free sweets. The items listed above may not be a complete list of foods and beverages you can eat. Contact a dietitian for more information. What foods should I avoid? Fruits Canned fruit in a light or heavy syrup. Fried fruit. Fruit in cream or butter sauce. Vegetables Creamed or fried vegetables. Vegetables in a cheese sauce. Regular canned vegetables (not low-sodium or reduced-sodium). Regular canned tomato sauce and paste (not low-sodium or reduced-sodium). Regular tomato and vegetable juice (not low-sodium or reduced-sodium). Angie Fava. Olives. Grains Baked goods made with fat, such as croissants, muffins, or some breads. Dry pasta or rice meal packs. Meats and other proteins Fatty  cuts of meat. Ribs. Fried meat. Tomasa Blase. Bologna, salami, and other precooked or cured meats, such as sausages or meat loaves. Fat from the back of a pig (fatback). Bratwurst. Salted nuts and seeds. Canned beans with added salt. Canned or smoked fish. Whole eggs or egg yolks. Chicken or [place] with skin. Dairy Whole or 2% milk, cream, and half-and-half. Whole or full-fat cream cheese. Whole-fat or sweetened yogurt. Full-fat cheese. Nondairy creamers. Whipped toppings. Processed cheese and cheese spreads. Fats and oils Butter. Stick margarine. Lard. Shortening. Ghee. Bacon fat. Tropical oils,  such as coconut, palm kernel, or palm oil. Seasonings and condiments Onion salt, garlic salt, seasoned salt, table salt, and sea salt. Worcestershire sauce. Tartar sauce. Barbecue sauce. Teriyaki sauce. Soy sauce, including reduced-sodium. Steak sauce. Canned and packaged gravies. Fish sauce. Oyster sauce. Cocktail sauce. Store-bought horseradish. Ketchup. Mustard. Meat flavorings and tenderizers. Bouillon cubes. Hot sauces. Pre-made or packaged marinades. Pre-made or packaged taco seasonings. Relishes. Regular salad dressings. Other foods Salted popcorn and pretzels. The items listed above may not be a complete list of foods and beverages you should avoid. Contact a dietitian for more information. Where to find more information National Heart, Lung, and Blood Institute: PopSteam.is American Heart Association: www.heart.org Academy of Nutrition and Dietetics: www.eatright.org National Kidney Foundation: www.kidney.org Summary The DASH eating plan is a healthy eating plan that has been shown to reduce high blood pressure (hypertension). It may also reduce your risk for type 2 diabetes, heart disease, and stroke. When on the DASH eating plan, aim to eat more fresh fruits and vegetables, whole grains, lean proteins, low-fat dairy, and heart-healthy fats. With the DASH eating plan, you should limit salt (sodium) intake to 2,300 mg a day. If you have hypertension, you may need to reduce your sodium intake to 1,500 mg a day. Work with your health care provider or dietitian to adjust your eating plan to your individual calorie needs. This information is not intended to replace advice given to you by your health care provider. Make sure you discuss any questions you have with your health care provider. Document Revised: 03/11/2019 Document Reviewed: 03/11/2019 Elsevier Patient Education  2023 [employer].

## 2022-01-24 ENCOUNTER — Telehealth: Payer: Self-pay | Admitting: Licensed Clinical Social Worker

## 2022-01-24 NOTE — Telephone Encounter (Signed)
H&V Care Navigation CSW Progress Note  Clinical Social Worker contacted patient by phone to f/u on referral to care navigation. Pt uninsured and his PCP has left the practice and is in need of a new one. No answer again today at [phone]. No voicemail set up. Will re-attempt as able, will place assistance applications in the mail along with my card.   Patient is participating in a Managed Medicaid Plan:  No, self pay.   SDOH Screenings   Food Insecurity: No Food Insecurity (01/23/2022)  Housing: Low Risk  (01/23/2022)  Transportation Needs: No Transportation Needs (01/23/2022)  Utilities: Not At Risk (01/23/2022)  Alcohol Screen: Low Risk  (01/23/2022)  Depression (PHQ2-9): Low Risk  (11/20/2021)  Financial Resource Strain: Low Risk  (01/23/2022)  Physical Activity: Insufficiently Active (01/23/2022)  Social Connections: Socially Isolated (01/23/2022)  Stress: No Stress Concern Present (01/23/2022)  Tobacco Use: Medium Risk (01/23/2022)   Westley Hummer, MSW, LCSW Clinical Social Worker II [place]  [phone]- work cell phone (preferred) [phone]- desk phone

## 2022-01-25 ENCOUNTER — Telehealth: Payer: Self-pay

## 2022-01-25 NOTE — Telephone Encounter (Signed)
Call to pt reference PREP referral Unable to leave message due to 'not available, call another time'

## 2022-01-27 ENCOUNTER — Telehealth (HOSPITAL_BASED_OUTPATIENT_CLINIC_OR_DEPARTMENT_OTHER): Payer: Self-pay | Admitting: Family

## 2022-01-27 NOTE — Telephone Encounter (Signed)
Called to discuss scheduling the renal artery duplex ordered by Laurann Montana, NP---call could not be completed

## 2022-01-28 ENCOUNTER — Telehealth: Payer: Self-pay | Admitting: Licensed Clinical Social Worker

## 2022-01-28 NOTE — Telephone Encounter (Signed)
H&V Care Navigation CSW Progress Note  Clinical Social Worker contacted patient by phone to f/u on referral. No answer again at [phone], unable to leave voicemail- at this point it is unclear if this is a working number. I then called emergency contact on DPR (pt father, also the number listed for pt mother) [phone]. This number is not in service. I noted additional number listed as "work", was able to leave a voicemail on this phone at [phone]. Will mail pt resources if no call back.   Patient is participating in a Managed Medicaid Plan:  No, self pay only.  SDOH Screenings   Food Insecurity: No Food Insecurity (01/23/2022)  Housing: Low Risk  (01/23/2022)  Transportation Needs: No Transportation Needs (01/23/2022)  Utilities: Not At Risk (01/23/2022)  Alcohol Screen: Low Risk  (01/23/2022)  Depression (PHQ2-9): Low Risk  (11/20/2021)  Financial Resource Strain: Low Risk  (01/23/2022)  Physical Activity: Insufficiently Active (01/23/2022)  Social Connections: Socially Isolated (01/23/2022)  Stress: No Stress Concern Present (01/23/2022)  Tobacco Use: Medium Risk (01/23/2022)   Westley Hummer, MSW, LCSW Clinical Social Worker II [place]  [phone]- work cell phone (preferred) [phone]- desk phone

## 2022-01-29 ENCOUNTER — Encounter (HOSPITAL_BASED_OUTPATIENT_CLINIC_OR_DEPARTMENT_OTHER): Payer: Self-pay | Admitting: Family

## 2022-01-29 NOTE — Telephone Encounter (Signed)
Called to discuss scheduling the renal artery duplex ordered by Laurann Montana, NP---call could not be completed---will mail letter requesting patient call.

## 2022-01-31 ENCOUNTER — Encounter (HOSPITAL_BASED_OUTPATIENT_CLINIC_OR_DEPARTMENT_OTHER): Payer: Self-pay

## 2022-01-31 ENCOUNTER — Telehealth: Payer: Self-pay | Admitting: Licensed Clinical Social Worker

## 2022-01-31 DIAGNOSIS — I10 Essential (primary) hypertension: Secondary | ICD-10-CM

## 2022-01-31 NOTE — Progress Notes (Signed)
Heart and Vascular Care Navigation  01/31/2022  Cameron Wells 03-Apr-1971 [id number]  Reason for Referral:  Patient is participating in a Managed Medicaid Plan: No, self pay only.  Engaged with patient by telephone for initial visit for Heart and Vascular Care Coordination.                                                                                                   Assessment:                                     Pt contacted me back today-  he shares that his number is [phone], he is unsure why I was having issues (note both [facility] and other [facility] staff have been unable to reach him at that number). He updates his work # to [phone], I added this to the chart. He confirms his pt number is as noted and is also unsure why it also did not work- except for maybe b/c it is a landline. Pt lives with his wife and children. He believes some of his children have Medicaid, he didn't think he qualified/but isnt sure if he applied. Pt states that he would double check with his wife. He works full time but [employer] offered through work is far too expensive for him to utilize per his report.  Since he has steady income I shared with him that some of the assistance programs he may not be eligible for but encouraged him to at least apply and see if he may even qualify for partial discount.   I also shared that I had sent pt a list of primary care groups, including [place] PCP's noting which one [place] directed me to have patients call. Pt will utilize these resources to find another PCP.   No additional questions/concerns at this time.    HRT/VAS Care Coordination     Patients Home Cardiology Office --  [facility]   Outpatient Care Team Social Worker   Social Worker Name: Valeda Malm, [facility] [phone]   Living arrangements for the past 2 months Single Family Home   Lives with: Minor Children; Spouse   Patient Current Insurance Coverage Self-Pay    Patient Has Concern With Paying Medical Bills Yes   Patient Concerns With Medical Bills no insurance, ongoing care   Medical Bill Referrals: [facility] Assistance, [facility]   Does Patient Have Prescription Coverage? No   Patient Prescription Assistance Programs [place] [facility]   [place] [facility] Medications mailed new application       Social History:                                                                             SDOH Screenings  Food Insecurity: No Food Insecurity (01/31/2022)  Housing: Low Risk  (01/31/2022)  Transportation Needs: No Transportation Needs (01/31/2022)  Utilities: Not At Risk (01/31/2022)  Alcohol Screen: Low Risk  (01/23/2022)  Depression (PHQ2-9): Low Risk  (11/20/2021)  Financial Resource Strain: Low Risk  (01/31/2022)  Physical Activity: Insufficiently Active (01/23/2022)  Social Connections: Socially Isolated (01/23/2022)  Stress: No Stress Concern Present (01/23/2022)  Tobacco Use: Medium Risk (01/23/2022)    SDOH Interventions: Financial Resources:  Financial Strain Interventions: Other (Comment) Water engineer Assistance; [facility]; [employer]) DSS for financial assistance and Occupational hygienist for [place] Insecurity:  Food Insecurity Interventions: Intervention Not Indicated  Housing Insecurity:  Housing Interventions: Intervention Not Indicated  Transportation:   Transportation Interventions: Intervention Not Indicated    Other Care Navigation Interventions:     Provided Pharmacy assistance resources [place] [facility]   Follow-up plan:   LCSW has mailed pt applications- he confirmed he received them and will work on them with his wife. He was encouraged to call me with any questions. I will f/u with pt if I do not hear from him in the next few weeks to ensure no additional questions/concerns arise.

## 2022-02-07 ENCOUNTER — Telehealth: Payer: Self-pay

## 2022-02-07 ENCOUNTER — Telehealth: Payer: Self-pay | Admitting: Licensed Clinical Social Worker

## 2022-02-07 NOTE — Telephone Encounter (Signed)
H&V Care Navigation CSW Progress Note  Clinical Social Worker contacted patient by phone to f/u on assistance applications. Again unable to get through and unable to leave a voicemail. PREP program also continues to have challenges with getting through. I have sent pt a text message which noted as delivered to pt.   Patient is participating in a Managed Medicaid Plan:  No, self pay.   SDOH Screenings   Food Insecurity: No Food Insecurity (01/31/2022)  Housing: Low Risk  (01/31/2022)  Transportation Needs: No Transportation Needs (01/31/2022)  Utilities: Not At Risk (01/31/2022)  Alcohol Screen: Low Risk  (01/23/2022)  Depression (PHQ2-9): Low Risk  (11/20/2021)  Financial Resource Strain: Low Risk  (01/31/2022)  Physical Activity: Insufficiently Active (01/23/2022)  Social Connections: Socially Isolated (01/23/2022)  Stress: No Stress Concern Present (01/23/2022)  Tobacco Use: Medium Risk (01/23/2022)   Westley Hummer, MSW, LCSW Clinical Social Worker II [place]  [phone]- work cell phone (preferred) [phone]- desk phone

## 2022-02-07 NOTE — Telephone Encounter (Signed)
Attempted to reach patient reference PREP referral.  Unable to leave message 'not available'

## 2022-02-10 ENCOUNTER — Telehealth: Payer: Self-pay | Admitting: Licensed Clinical Social Worker

## 2022-02-10 NOTE — Telephone Encounter (Signed)
H&V Care Navigation CSW Progress Note   Clinical Social Worker contacted patient by phone to f/u on assistance applications. Again unable to get through and unable to leave a voicemail. My previous text was also unanswered. I will send mychart message.     Patient is participating in a Managed Medicaid Plan:  No, self pay.     SDOH Screenings   Food Insecurity: No Food Insecurity (01/31/2022)  Housing: Low Risk  (01/31/2022)  Transportation Needs: No Transportation Needs (01/31/2022)  Utilities: Not At Risk (01/31/2022)  Alcohol Screen: Low Risk  (01/23/2022)  Depression (PHQ2-9): Low Risk  (11/20/2021)  Financial Resource Strain: Low Risk  (01/31/2022)  Physical Activity: Insufficiently Active (01/23/2022)  Social Connections: Socially Isolated (01/23/2022)  Stress: No Stress Concern Present (01/23/2022)  Tobacco Use: Medium Risk (01/23/2022)   Westley Hummer, MSW, LCSW Clinical Social Worker II [place]  [phone]- work cell phone (preferred) [phone]- desk phone

## 2022-02-12 ENCOUNTER — Telehealth: Payer: Self-pay | Admitting: *Deleted

## 2022-02-12 NOTE — Telephone Encounter (Signed)
Second attempt to contact regarding PREP class referral. Unable to leave voice message.

## 2022-02-18 MED ORDER — AMLODIPINE BESYLATE 5 MG PO TABS: 5.0000 mg | ORAL_TABLET | Freq: Every day | ORAL | 2 refills | Status: DC

## 2022-02-21 ENCOUNTER — Ambulatory Visit (HOSPITAL_BASED_OUTPATIENT_CLINIC_OR_DEPARTMENT_OTHER)
Admission: RE | Admit: 2022-02-21 | Discharge: 2022-02-21 | Disposition: A | Discharge status: Home / Self Care | Payer: HRSA Program | Source: Ambulatory Visit | Attending: Family | Admitting: Family

## 2022-02-21 DIAGNOSIS — I10 Essential (primary) hypertension: Secondary | ICD-10-CM | POA: Insufficient documentation

## 2022-02-21 DIAGNOSIS — R9431 Abnormal electrocardiogram [ECG] [EKG]: Secondary | ICD-10-CM | POA: Insufficient documentation

## 2022-02-24 ENCOUNTER — Telehealth: Payer: Self-pay | Admitting: Licensed Clinical Social Worker

## 2022-02-24 ENCOUNTER — Other Ambulatory Visit (HOSPITAL_BASED_OUTPATIENT_CLINIC_OR_DEPARTMENT_OTHER): Payer: Self-pay | Admitting: Family

## 2022-02-24 ENCOUNTER — Telehealth (HOSPITAL_BASED_OUTPATIENT_CLINIC_OR_DEPARTMENT_OTHER): Payer: Self-pay

## 2022-02-24 DIAGNOSIS — E785 Hyperlipidemia, unspecified: Secondary | ICD-10-CM

## 2022-02-24 MED ORDER — ROSUVASTATIN CALCIUM 20 MG PO TABS: 20.0000 mg | ORAL_TABLET | Freq: Every day | ORAL | 3 refills | Status: DC

## 2022-02-24 MED ORDER — NITROGLYCERIN 0.4 MG SL SUBL: 0.4000 mg | SUBLINGUAL_TABLET | SUBLINGUAL | 3 refills | Status: AC | PRN

## 2022-02-24 MED ORDER — ASPIRIN 81 MG PO TBEC: 81.0000 mg | DELAYED_RELEASE_TABLET | Freq: Every day | ORAL | 3 refills | Status: AC

## 2022-02-24 NOTE — Telephone Encounter (Addendum)
Seen by patient Cameron Wells on 02/21/2022  6:25 PM; follow up mychart message sent to patient.     ----- Message from Loel Dubonnet, NP sent at 02/21/2022  4:56 PM EDT ----- Coronary calcium score of 13.7. Indicates mild plaque in the heart. Recommend aggressive secondary prevention of coronary artery disease. Start Aspirin EC 81mg  daily. Continue Carvedilol. Increase Rosuvastatin to 20mg  daily with FLP/LFT in 2 months. Recommend use the GoodRx coupon as he can get 90 day supply for $10 if he registers for free with Good Rx or $25 if does not register for the account.

## 2022-02-24 NOTE — Telephone Encounter (Signed)
H&V Care Navigation CSW Progress Note  Clinical Social Worker contacted patient by phone to f/u on assistance options provided. Continue to be unable to leave voicemail/connect with pt. I will send MyChart message as a check in.   Patient is participating in a Managed Medicaid Plan:  No, self pay only  [place]: No Food Insecurity (01/31/2022)  Housing: Low Risk  (01/31/2022)  Transportation Needs: No Transportation Needs (01/31/2022)  Utilities: Not At Risk (01/31/2022)  Alcohol Screen: Low Risk  (01/23/2022)  Depression (PHQ2-9): Low Risk  (11/20/2021)  Financial Resource Strain: Low Risk  (01/31/2022)  Physical Activity: Insufficiently Active (01/23/2022)  Social Connections: Socially Isolated (01/23/2022)  Stress: No Stress Concern Present (01/23/2022)  Tobacco Use: Medium Risk (01/23/2022)    Westley Hummer, MSW, LCSW Clinical Social Worker II [place]  [phone]- work cell phone (preferred) [phone]- desk phone

## 2022-02-24 NOTE — Telephone Encounter (Signed)
H&V Care Navigation CSW Progress Note  Clinical Social Worker  received call back from pt  to f/u on my MyChart message. We discussed how to submit documents and I will send him various options for submitting each application.   Patient is participating in a Managed Medicaid Plan:  No, self pay only.   SDOH Screenings   Food Insecurity: No Food Insecurity (01/31/2022)  Housing: Low Risk  (01/31/2022)  Transportation Needs: No Transportation Needs (01/31/2022)  Utilities: Not At Risk (01/31/2022)  Alcohol Screen: Low Risk  (01/23/2022)  Depression (PHQ2-9): Low Risk  (11/20/2021)  Financial Resource Strain: Low Risk  (01/31/2022)  Physical Activity: Insufficiently Active (01/23/2022)  Social Connections: Socially Isolated (01/23/2022)  Stress: No Stress Concern Present (01/23/2022)  Tobacco Use: Medium Risk (01/23/2022)   Westley Hummer, MSW, LCSW Clinical Social Worker II [place]  [phone]- work cell phone (preferred) [phone]- desk phone

## 2022-03-05 ENCOUNTER — Ambulatory Visit (INDEPENDENT_AMBULATORY_CARE_PROVIDER_SITE_OTHER): Payer: Self-pay

## 2022-03-05 DIAGNOSIS — I10 Essential (primary) hypertension: Secondary | ICD-10-CM

## 2022-03-05 DIAGNOSIS — R9431 Abnormal electrocardiogram [ECG] [EKG]: Secondary | ICD-10-CM

## 2022-03-06 LAB — LIPID PANEL
Chol/HDL Ratio: 5.3 ratio — ABNORMAL HIGH (ref 0.0–5.0)
Cholesterol, Total: 179 mg/dL (ref 100–199)
HDL: 34 mg/dL — ABNORMAL LOW (ref >39)
LDL Chol Calc (NIH): 107 mg/dL — ABNORMAL HIGH (ref 0–99)
Triglycerides: 220 mg/dL — ABNORMAL HIGH (ref 0–149)
VLDL Cholesterol Cal: 38 mg/dL (ref 5–40)

## 2022-03-06 LAB — HEPATIC FUNCTION PANEL
ALT: 19 IU/L (ref 0–44)
AST: 23 IU/L (ref 0–40)
Albumin: 4.8 g/dL (ref 3.8–4.9)
Alkaline Phosphatase: 54 IU/L (ref 44–121)
Bilirubin Total: 0.4 mg/dL (ref 0.0–1.2)
Bilirubin, Direct: 0.13 mg/dL (ref 0.00–0.40)
Total Protein: 7.8 g/dL (ref 6.0–8.5)

## 2022-03-06 MED ORDER — AMLODIPINE BESYLATE 10 MG PO TABS: 10.0000 mg | ORAL_TABLET | Freq: Every day | ORAL | 3 refills | Status: DC

## 2022-03-06 NOTE — Addendum Note (Signed)
Addended by: Marlene Lard on: 03/06/2022 08:31 AM   Modules accepted: Orders

## 2022-03-07 ENCOUNTER — Telehealth (HOSPITAL_BASED_OUTPATIENT_CLINIC_OR_DEPARTMENT_OTHER): Payer: Self-pay

## 2022-03-07 DIAGNOSIS — E785 Hyperlipidemia, unspecified: Secondary | ICD-10-CM

## 2022-03-07 MED ORDER — ROSUVASTATIN CALCIUM 40 MG PO TABS: 40.0000 mg | ORAL_TABLET | Freq: Every day | ORAL | 3 refills | Status: DC

## 2022-03-07 NOTE — Telephone Encounter (Addendum)
Results called to patient who verbalizes understanding! Labs ordered and mailed, prescriptions updated and sent to pharmacy on file.      ----- Message from Creig Hines, NP sent at 03/06/2022  5:31 PM EST ----- Total cholesterol of 179 with triglycerides of 220 (high), HDL of 34 (low), and LDL of 107 (mildly high).  Overall, improved following initiation of Crestor therapy.  With evidence of coronary calcium, who would like his LDL to be at least less than 100, if not lower.  In that setting, I recommend increasing Crestor to 40 mg daily with plan for follow-up lipids and LFTs in about 6 to 8 weeks.  Strongly encourage regular exercise and whole food plant-based diet.    No evidence of renal artery stenosis.

## 2022-03-10 ENCOUNTER — Other Ambulatory Visit: Payer: Self-pay | Admitting: Family Medicine

## 2022-03-10 DIAGNOSIS — Z889 Allergy status to unspecified drugs, medicaments and biological substances status: Secondary | ICD-10-CM

## 2022-05-01 ENCOUNTER — Encounter (HOSPITAL_BASED_OUTPATIENT_CLINIC_OR_DEPARTMENT_OTHER): Payer: Self-pay | Admitting: Family

## 2022-05-01 ENCOUNTER — Ambulatory Visit (INDEPENDENT_AMBULATORY_CARE_PROVIDER_SITE_OTHER): Payer: Self-pay | Admitting: Family

## 2022-05-01 VITALS — BP 143/87 | HR 59 | Ht 71.0 in | Wt 206.0 lb

## 2022-05-01 DIAGNOSIS — I10 Essential (primary) hypertension: Secondary | ICD-10-CM

## 2022-05-01 DIAGNOSIS — I25118 Atherosclerotic heart disease of native coronary artery with other forms of angina pectoris: Secondary | ICD-10-CM

## 2022-05-01 DIAGNOSIS — R0609 Other forms of dyspnea: Secondary | ICD-10-CM

## 2022-05-01 DIAGNOSIS — E118 Type 2 diabetes mellitus with unspecified complications: Secondary | ICD-10-CM

## 2022-05-01 DIAGNOSIS — R42 Dizziness and giddiness: Secondary | ICD-10-CM

## 2022-05-01 DIAGNOSIS — E785 Hyperlipidemia, unspecified: Secondary | ICD-10-CM

## 2022-05-01 DIAGNOSIS — N1831 Chronic kidney disease, stage 3a: Secondary | ICD-10-CM

## 2022-05-01 MED ORDER — CARVEDILOL 12.5 MG PO TABS: 12.5000 mg | ORAL_TABLET | Freq: Two times a day (BID) | ORAL | 3 refills | Status: DC

## 2022-05-01 NOTE — Progress Notes (Signed)
Advanced [facility]pertension Clinic Initial Assessment:    Date:  05/01/2022   ID:  Cameron Wells, DOB 1971-02-23, MRN [id number]  PCP:  Pcp, No  Cardiologist:  None  Nephrologist:  Referring MD: No ref. provider found   CC: Hypertension  History of Present Illness:    Cameron Wells is a 52 y.o. male with a hx of HTN, HLD, DM2, anxiety/depression here to follow up in the Advanced Hypertension Clinic.   Saw primary care 11/20/21 noted uncontrolled blood pressure 172/100 despite Losartan-HCTZ 100-25mg  QD, Toprol 100mg  QD. Referred to hypertension clinic.   Established with [facility] 01/23/22. Works in [employer]	[place]. 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 Lives at home with his wife and two children. Cameron Wells was diagnosed with hypertension in his early 28s. Former tobacco use having smoked from age 85 until 2011. Alcohol use 3-4 times per week with 3 drinks. No formal exercise routine. Uses CPAP regularly.   At initial visit his blood pressure was 199/123.  Losartan-HCTZ was stopped.  He was started on valsartan-HCTZ 320 to 25 mg daily.  Metoprolol stopped and he was started on carvedilol 25 mg twice daily. Cortisol, catecholamines, metanephrines were ordered but not obtained.  Renal duplex with no stenosis.  Coronary calcium score with coronary calcium score of 13.7 placing him the 79th percentile.  Aspirin initiated.  Rosuvastatin increased to 20 mg daily and later 40mg  daily.   He presents today with a myriad of concerns including mind fog, weakness, vision impedance, foamy urine, urinary frequency, labored breathing, feeling faint.  Tells me the week before 12/18 he had shingles under his right arm he did get treated at urgent care. He took Gaffer. He was frustrated that he did not get anything for pain.   A week later started feeling "mind fog" and weakness. Felt like he could barely lift his head. This is when he stopped Rosuvastatin and felt  muscle weakness improved. He is concerned about "long range" vision in his right eye but has not seen ophthalmology.   Feels he is using the bathroom every 15 minutes as well as at night and it has been foamy. Reports losing 20 pounds without explanation and being constantly thirsty. Endorses eating a lot of fruit and is concerned about blood sugar control. He also notes difficulty with ED.   He tells me he has transitioned to a mediterranean diet. Plans to walk the dog more for exercise.   He has been checking blood pressure at home with readings averaging 130-138/84-100. We discussed him feeling dizzy and faint is not related to hypotension. He reports feeling short of breath "anytime I move". He is worried that he read Amlodipine causes heart attacks and reassurance it was safe for [place].   He has not filled out paperwork for assistance as provided by social work team.   Previous antihypertensives: Losartan-HCTZ - transitioned to Valsartan Metoprolol - transitioned to Carvedilol  Past Medical History:  Diagnosis Date   Allergy    Anxiety    Asthma    DM (diabetes mellitus) ([place])    Hyperlipidemia    Hypertension    Panic attack     History reviewed. No pertinent surgical history.  Current Medications: Current Meds  Medication Sig   amLODipine (NORVASC) 10 MG tablet Take 1 tablet (10 mg total) by mouth daily.   aspirin EC 81 MG tablet Take 1 tablet (81 mg total) by mouth daily. Swallow whole.   cetirizine (ZYRTEC) 10  MG tablet TAKE 1 TABLET BY MOUTH ONCE DAILY ** COURTESY REFILL. NEEDS OFFICE VISIT AND LABS BEFORE FURTHER REFILLS**   EPINEPHrine 0.3 mg/0.3 mL IJ SOAJ injection Inject 0.3 mg into the muscle as needed for anaphylaxis.   FLUoxetine (PROZAC) 40 MG capsule Take 1 capsule (40 mg total) by mouth daily.   fluticasone (FLONASE) 50 MCG/ACT nasal spray Use 2 spray(s) in each nostril once daily   metFORMIN (GLUCOPHAGE) 500 MG tablet Take 1 tablet (500 mg total) by mouth 2  (two) times daily with a meal.   nitroGLYCERIN (NITROSTAT) 0.4 MG SL tablet Place 1 tablet (0.4 mg total) under the tongue every 5 (five) minutes as needed for chest pain.   omeprazole (PRILOSEC OTC) 20 MG tablet Take 20 mg by mouth daily.   valsartan-hydrochlorothiazide (DIOVAN-HCT) 320-25 MG tablet Take 1 tablet by mouth daily.   [DISCONTINUED] carvedilol (COREG) 25 MG tablet Take 1 tablet (25 mg total) by mouth 2 (two) times daily.     Allergies:   Penicillins   Social History   Socioeconomic History   Marital status: Married    Spouse name: Not on file   Number of children: Not on file   Years of education: Not on file   Highest education level: Not on file  Occupational History   Not on file  Tobacco Use   Smoking status: Former    Types: Cigarettes    Quit date: 2011    Years since quitting: 13.0   Smokeless tobacco: Never  Vaping Use   Vaping Use: Never used  Substance and Sexual Activity   Alcohol use: Yes    Alcohol/week: 0.0 standard drinks of alcohol   Drug use: No   Sexual activity: Not on file  Other Topics Concern   Not on file  Social History Narrative   Not on file   Social Determinants of Health   Financial Resource Strain: Low Risk  (01/31/2022)   Overall Financial Resource Strain (CARDIA)    Difficulty of Paying Living Expenses: Not very hard  Food Insecurity: No Food Insecurity (01/31/2022)   Hunger Vital Sign    Worried About Running Out of Food in the Last Year: Never true    Ran Out of Food in the Last Year: Never true  Transportation Needs: No Transportation Needs (01/31/2022)   PRAPARE - Hydrologist (Medical): No    Lack of Transportation (Non-Medical): No  Physical Activity: Insufficiently Active (01/23/2022)   Exercise Vital Sign    Days of Exercise per Week: 2 days    Minutes of Exercise per Session: 30 min  Stress: No Stress Concern Present (01/23/2022)   [place]    Feeling of Stress : Only a little  Social Connections: Socially Isolated (01/23/2022)   Social Connection and Isolation Panel [NHANES]    Frequency of Communication with Friends and Family: Twice a week    Frequency of Social Gatherings with Friends and Family: Never    Attends Religious Services: Never    Marine scientist or Organizations: No    Attends Music therapist: Never    Marital Status: Married     Family History: The patient's family history includes CAD in his maternal grandmother; Cancer in his paternal grandfather; Hypertension in his maternal aunt.  ROS:   Please see the history of present illness.     All other systems reviewed and are negative.  EKGs/Labs/Other Studies  Reviewed:    EKG:  EKG is ordered today.  The ekg ordered today demonstrates NSR 76 bpm with T wave inversion V4-V6 suggestive of lateral ischemia.  Recent Labs: 11/20/2021: BUN 16; Creatinine, Ser 1.47; Hemoglobin 13.7; Platelets 178.0; Potassium 3.4; Sodium 138; TSH 1.01 03/05/2022: ALT 19   Recent Lipid Panel    Component Value Date/Time   CHOL 179 03/05/2022 0903   TRIG 220 (H) 03/05/2022 0903   HDL 34 (L) 03/05/2022 0903   CHOLHDL 5.3 (H) 03/05/2022 0903   CHOLHDL 6 11/20/2021 1136   VLDL 65.8 (H) 11/20/2021 1136   LDLCALC 107 (H) 03/05/2022 0903   LDLDIRECT 145.0 11/20/2021 1136    Physical Exam:   VS:  BP (!) 143/87   Pulse (!) 59   Ht 5\' 11"  (1.803 m)   Wt 206 lb (93.4 kg)   BMI 28.73 kg/m  , BMI Body mass index is 28.73 kg/m. GENERAL:  Well appearing HEENT: Pupils equal round and reactive, fundi not visualized, oral mucosa unremarkable NECK:  No jugular venous distention, waveform within normal limits, carotid upstroke brisk and symmetric, no bruits, no thyromegaly LYMPHATICS:  No cervical adenopathy LUNGS:  Clear to auscultation bilaterally HEART:  RRR.  PMI not displaced or sustained,S1 and S2 within normal limits, no  S3, no S4, no clicks, no rubs, no murmurs ABD:  Flat, positive bowel sounds normal in frequency in pitch, no bruits, no rebound, no guarding, no midline pulsatile mass, no hepatomegaly, no splenomegaly EXT:  2 plus pulses throughout, no edema, no cyanosis no clubbing SKIN:  No rashes no nodules NEURO:  Cranial nerves II through XII grossly intact, motor grossly intact throughout PSYCH:  Cognitively intact, oriented to person place and time   ASSESSMENT/PLAN:    HTN - BP improved but not yet at goal <130/80. COntinue Amlodipine 10mg  QD, Valsartan-HCTZ 320-25mg  QD. Reduce Carvedilol from 25mg  to 12.5mg  BID due to reports of fatigue, lightheadedness, bradycardia. Discussed to monitor BP at home at least 2 hours after medications and sitting for 5-10 minutes. He is hesitant regarding additional medications as he feels poorly today. Readdress at follow up.  11/20/21 normal thyroid. Metanephrines, catecholamines, cortisol previously ordered but not collected.  Reports compliance with CPAP.  BP symmetric, no carotid duplex needed.  Renal artery duplex no stenosis.  Previously [facility]P exercise program [facility].  Heart healthy diet and regular cardiovascular exercise encouraged.    Bradycardia / Lightheadedness / Dizziness - HR 59 bpm today. Will reduce Carvedilol from 25mg  to 12.5mg  BID. COncern for vertigo, encouraged OTC Meclizine. Await lab results including BMP, mag, TSH. No murmur nor carotid bruit appreciated on exam and anticipate cost prohibitive without insurance.  Financial constraint - Encouraged him to complete financial assistance applications as previously provided by our social work team.   OSA - CPAP compliance encouraged.   CAD / HLD - Known TWI lateral leads. 02/21/22 coronary calcium score of 13.7. Atypical infrequent sharp midsternal chest pain at rest that resolves within seconds. Reassurance provided. No indication for ischemic evaluation. Continue Aspirin. Not interested  in statin as noted myalgias on 40mg  dose. Discuss again at follow up.  Could consider lower dose.   DM2 - 11/20/21 A1c 6.9. Provided resource to establish with new PCP. Endorses urinary frequency, weight loss, polydipsia. Update A1c.   ED - Encouraged to establish with PCP for management.  CKD 3a - Careful titration of diuretic and antihypertensive.  BMP today.   Depression/Anxiety -  Has not taken Prozac  in some time. Discussed fatigue could be sign of depression and encouraged to re-establish with primary care.   Screening for Secondary Hypertension:   Click here to document screening for secondary causes of HTN  :[id number]}     05/01/2022    8:32 PM  Causes  Renovascular HTN Screened  Sleep Apnea Not Screened  Thyroid Disease Screened  Hyperparathyroidism Screened  Coarctation of the Aorta Screened     - Comments BP symmetrical   Relevant Labs/Studies:    Latest Ref Rng & Units 11/20/2021   11:36 AM 12/16/2019    2:32 PM 04/27/2019    2:59 PM  Basic Labs  Sodium 135 - 145 mEq/L 138  144  141   Potassium 3.5 - 5.1 mEq/L 3.4  4.3  4.4   Creatinine 0.40 - 1.50 mg/dL 5.18  8.41  6.60        Latest Ref Rng & Units 11/20/2021   11:36 AM 12/16/2019    2:32 PM  Thyroid   TSH 0.35 - 5.50 uIU/mL 1.01  0.664        Latest Ref Rng & Units 04/27/2019    2:59 PM  Renin/Aldosterone   Aldosterone 0.0 - 30.0 ng/dL 63.0   Renin 1.601 - 0.932 ng/mL/hr 0.236  C  Aldos/Renin Ratio 0.0 - 30.0 97.0  C    C Corrected result             03/05/2022   10:37 AM  Renovascular   Renal Artery [place] Completed Yes    Disposition:    FU with MD/PharmD in 2 months    Medication Adjustments/Labs and Tests Ordered: Current medicines are reviewed at length with the patient today.  Concerns regarding medicines are outlined above.  Orders Placed This Encounter  Procedures   HgB A1c   CBC   Basic metabolic panel   Magnesium   Meds ordered this encounter  Medications   carvedilol (COREG) 12.5 MG  tablet    Sig: Take 1 tablet (12.5 mg total) by mouth 2 (two) times daily.    Dispense:  180 tablet    Refill:  3   Signed, Alver Sorrow, NP  05/01/2022 8:39 PM    [facility]

## 2022-05-01 NOTE — Patient Instructions (Addendum)
Medication Instructions:  Your physician has recommended you make the following change in your medication:   Can take Meclizine over the counter as needed for dizziness  REDUCE Carvedilol to 12.5mg  (half tablet) twice daily   Labwork: Your physician recommends that you return for lab work today: CBC, BMP, magnesium, A1c   Testing/Procedures: None ordered today.    Follow-Up: In 1-2 months with Dr. Oval Linsey in Hypertension Clinic   Referrals:  Recommend establish with new primary care provider.    Special Instructions:   To prevent lightheadedness: Change positions slowly Stay well hydrated

## 2022-05-02 ENCOUNTER — Ambulatory Visit (HOSPITAL_COMMUNITY)
Admission: EM | Admit: 2022-05-02 | Discharge: 2022-05-02 | Disposition: A | Discharge status: Home / Self Care | Payer: Self-pay | Attending: Physician Assistant | Admitting: Physician Assistant

## 2022-05-02 ENCOUNTER — Encounter (HOSPITAL_COMMUNITY): Payer: Self-pay | Admitting: Emergency Medicine

## 2022-05-02 ENCOUNTER — Telehealth: Payer: Self-pay | Admitting: Cardiovascular Disease

## 2022-05-02 DIAGNOSIS — E1165 Type 2 diabetes mellitus with hyperglycemia: Secondary | ICD-10-CM | POA: Insufficient documentation

## 2022-05-02 DIAGNOSIS — E119 Type 2 diabetes mellitus without complications: Secondary | ICD-10-CM | POA: Insufficient documentation

## 2022-05-02 LAB — POCT URINALYSIS DIPSTICK, ED / UC
Bilirubin Urine: NEGATIVE
Glucose, UA: >=1000 mg/dL — AB
Ketones, ur: 80 mg/dL — AB
Leukocytes,Ua: NEGATIVE
Nitrite: NEGATIVE
Protein, ur: 30 mg/dL — AB
Specific Gravity, Urine: <=1.005 (ref 1.005–1.030)
Urobilinogen, UA: 0.2 mg/dL (ref 0.0–1.0)
pH: 5.5 (ref 5.0–8.0)

## 2022-05-02 LAB — CBC
Hematocrit: 40.7 % (ref 37.5–51.0)
Hemoglobin: 13.6 g/dL (ref 13.0–17.7)
MCH: 29.4 pg (ref 26.6–33.0)
MCHC: 33.4 g/dL (ref 31.5–35.7)
MCV: 88 fL (ref 79–97)
Platelets: 220 10*3/uL (ref 150–450)
RBC: 4.62 x10E6/uL (ref 4.14–5.80)
RDW: 11.5 % — ABNORMAL LOW (ref 11.6–15.4)
WBC: 6.8 10*3/uL (ref 3.4–10.8)

## 2022-05-02 LAB — BASIC METABOLIC PANEL
BUN/Creatinine Ratio: 13 (ref 9–20)
BUN: 21 mg/dL (ref 6–24)
CO2: 21 mmol/L (ref 20–29)
Calcium: 10.5 mg/dL — ABNORMAL HIGH (ref 8.7–10.2)
Chloride: 91 mmol/L — ABNORMAL LOW (ref 96–106)
Creatinine, Ser: 1.67 mg/dL — ABNORMAL HIGH (ref 0.76–1.27)
Glucose: 542 mg/dL (ref 70–99)
Potassium: 4.4 mmol/L (ref 3.5–5.2)
Sodium: 136 mmol/L (ref 134–144)
eGFR: 49 mL/min/{1.73_m2} — ABNORMAL LOW (ref >59)

## 2022-05-02 LAB — MAGNESIUM: Magnesium: 2.6 mg/dL — ABNORMAL HIGH (ref 1.6–2.3)

## 2022-05-02 LAB — COMPREHENSIVE METABOLIC PANEL
ALT: 18 U/L (ref 0–44)
AST: 20 U/L (ref 15–41)
Albumin: 4.6 g/dL (ref 3.5–5.0)
Alkaline Phosphatase: 68 U/L (ref 38–126)
Anion gap: 17 — ABNORMAL HIGH (ref 5–15)
BUN: 17 mg/dL (ref 6–20)
CO2: 24 mmol/L (ref 22–32)
Calcium: 10.4 mg/dL — ABNORMAL HIGH (ref 8.9–10.3)
Chloride: 91 mmol/L — ABNORMAL LOW (ref 98–111)
Creatinine, Ser: 1.68 mg/dL — ABNORMAL HIGH (ref 0.61–1.24)
GFR, Estimated: 49 mL/min — ABNORMAL LOW (ref >60)
Glucose, Bld: 455 mg/dL — ABNORMAL HIGH (ref 70–99)
Potassium: 4.1 mmol/L (ref 3.5–5.1)
Sodium: 132 mmol/L — ABNORMAL LOW (ref 135–145)
Total Bilirubin: 1.4 mg/dL — ABNORMAL HIGH (ref 0.3–1.2)
Total Protein: 8.5 g/dL — ABNORMAL HIGH (ref 6.5–8.1)

## 2022-05-02 LAB — HEMOGLOBIN A1C
Est. average glucose Bld gHb Est-mCnc: 364 mg/dL
Hgb A1c MFr Bld: 14.3 % — ABNORMAL HIGH (ref 4.8–5.6)
Hgb A1c MFr Bld: 14.3 % — ABNORMAL HIGH (ref 4.8–5.6)
Mean Plasma Glucose: 363.71 mg/dL

## 2022-05-02 LAB — CBG MONITORING, ED: Glucose-Capillary: 465 mg/dL — ABNORMAL HIGH (ref 70–99)

## 2022-05-02 MED ORDER — BASAGLAR KWIKPEN 100 UNIT/ML ~~LOC~~ SOPN: 10.0000 [IU] | PEN_INJECTOR | Freq: Every day | SUBCUTANEOUS | 0 refills | Status: DC

## 2022-05-02 MED ORDER — SODIUM CHLORIDE 0.9 % IV BOLUS
1000.0000 mL | Freq: Once | INTRAVENOUS | Status: AC
  Administered 2022-05-02: 1000 mL via INTRAVENOUS

## 2022-05-02 MED ORDER — METFORMIN HCL 500 MG PO TABS: 1000.0000 mg | ORAL_TABLET | Freq: Two times a day (BID) | ORAL | 1 refills | Status: DC

## 2022-05-02 MED ORDER — PEN NEEDLES 30G X 8 MM MISC: 1.0000 | Freq: Every day | 0 refills | Status: DC

## 2022-05-02 MED ORDER — BLOOD GLUCOSE MONITOR KIT: PACK | 0 refills | Status: AC

## 2022-05-02 NOTE — Telephone Encounter (Signed)
Labs reviewed:  05/01/22:  wbc 6.8, Hb 12.6, Hct 40.7, PLt 220 Creatinine 1.67, GFR 49, glucose 542, Na 136, K 4.4, Ca 10.5, Mg 2.6  Discussed with Dr. Harrell Gave. His glucose (blood sugar) is markedly high. Needs to proceed to urgent care for evaluation of hyperglycemia including repeat lab work and urinalysis. May need insulin for management of blood sugar. If labs remain markedly abnormal will likely need evaluation in the emergency room. But may start at urgent care if he wishes.   Loel Dubonnet, NP

## 2022-05-02 NOTE — Telephone Encounter (Signed)
[facility] with critical results,   Critical glucose 542, labs not in EPIC yet, they are to be faxed over now

## 2022-05-02 NOTE — ED Triage Notes (Signed)
Pt was seen at PCP yesterday and called today and instructed to go to UC for Glucose 542.  Pt denies being a diabetic.   Pt has note from Provider Daphene Jaeger where can be reached at [phone] for more information.   Pt had extreme weakness, lost weight in 2 weeks, having blurred vision in left eye, mind fog for about a month.   Pt reports has been taking Metformin as prescribed for "pre-diabetes".

## 2022-05-02 NOTE — Telephone Encounter (Signed)
Summer with [facility] is calling to report critical lab results.  Phone#: [phone] (opt#:1)

## 2022-05-02 NOTE — ED Provider Notes (Signed)
[facility]    CSN: [id number] Arrival date & time: 05/02/22  1543      History   Chief Complaint Chief Complaint  Patient presents with   Hyperglycemia    HPI Cameron Wells is a 52 y.o. male.   Patient presents today at the recommendation of his cardiology NP.  He was evaluated yesterday in their office at which point metabolic panel obtained showed elevated glucose at 542.  He was instructed to return for evaluation.  He denies history of diabetes but chart review shows that he has a history of diabetes and is currently taking metformin 500 mg twice daily.  He denies any recent steroid or prednisone use.  Denies any medication changes.  He has been having polyuria, polydipsia, unintentional weight loss in the past several months, intermittent blurred vision.  He denies any shortness of breath, nausea, vomiting, weakness.  He does not currently have a primary care provider.  Denies family history of diabetes.  Denies any dietary changes and denies a diet that is heavy in carbohydrates.  He avoids sweetened drinks.    Past Medical History:  Diagnosis Date   Allergy    Anxiety    Asthma    DM (diabetes mellitus) (HCC)    Hyperlipidemia    Hypertension    Panic attack     Patient Active Problem List   Diagnosis Date Noted   Newly diagnosed diabetes (HCC) 01/03/2020   Dyslipidemia 04/15/2018   Adjustment disorder with mixed anxiety and depressed mood 08/10/2014   Panic attack 08/10/2014   Uncontrolled hypertension 06/19/2014    History reviewed. No pertinent surgical history.     Home Medications    Prior to Admission medications   Medication Sig Start Date End Date Taking? Authorizing Provider  blood glucose meter kit and supplies KIT Dispense based on patient and insurance preference. Use up to four times daily as directed. 05/02/22  Yes Renai Lopata, Denny Peon K, PA-C  Insulin Glargine (BASAGLAR KWIKPEN) 100 UNIT/ML Inject 10 Units into the skin at bedtime. 05/02/22   Yes Blanca Thornton K, PA-C  Insulin Pen Needle (PEN NEEDLES) 30G X 8 MM MISC 1 Needle by Does not apply route at bedtime. 05/02/22  Yes Allora Bains K, PA-C  amLODipine (NORVASC) 10 MG tablet Take 1 tablet (10 mg total) by mouth daily. 03/06/22 03/01/23  Alver Sorrow, NP  aspirin EC 81 MG tablet Take 1 tablet (81 mg total) by mouth daily. Swallow whole. 02/24/22   Alver Sorrow, NP  carvedilol (COREG) 12.5 MG tablet Take 1 tablet (12.5 mg total) by mouth 2 (two) times daily. 05/01/22 04/26/23  Alver Sorrow, NP  cetirizine (ZYRTEC) 10 MG tablet TAKE 1 TABLET BY MOUTH ONCE DAILY ** COURTESY REFILL. NEEDS OFFICE VISIT AND LABS BEFORE FURTHER REFILLS** 09/19/21   Janeece Agee, NP  EPINEPHrine 0.3 mg/0.3 mL IJ SOAJ injection Inject 0.3 mg into the muscle as needed for anaphylaxis. 03/13/21   Long, Arlyss Repress, MD  FLUoxetine (PROZAC) 40 MG capsule Take 1 capsule (40 mg total) by mouth daily. 11/20/21   Janeece Agee, NP  fluticasone Aleda Grana) 50 MCG/ACT nasal spray Use 2 spray(s) in each nostril once daily 07/04/21   Janeece Agee, NP  metFORMIN (GLUCOPHAGE) 500 MG tablet Take 2 tablets (1,000 mg total) by mouth 2 (two) times daily with a meal. 05/02/22   Alcario Tinkey K, PA-C  nitroGLYCERIN (NITROSTAT) 0.4 MG SL tablet Place 1 tablet (0.4 mg total) under the tongue every 5 (five)  minutes as needed for chest pain. 02/24/22 05/25/22  Alver SorrowWalker, Caitlin S, NP  omeprazole (PRILOSEC OTC) 20 MG tablet Take 20 mg by mouth daily.    [provider]  valsartan-hydrochlorothiazide (DIOVAN-HCT) 320-25 MG tablet Take 1 tablet by mouth daily. 01/23/22   Alver SorrowWalker, Caitlin S, NP    Family History Family History  Problem Relation Age of Onset   CAD Maternal Grandmother    Cancer Paternal Grandfather    Hypertension Maternal Aunt     Social History Social History   Tobacco Use   Smoking status: Former    Types: Cigarettes    Quit date: 2011    Years since quitting: 13.0   Smokeless tobacco: Never   Vaping Use   Vaping Use: Never used  Substance Use Topics   Alcohol use: Yes    Alcohol/week: 0.0 standard drinks of alcohol   Drug use: No     Allergies   Penicillins   Review of Systems Review of Systems  Constitutional:  Positive for activity change and unexpected weight change. Negative for appetite change, fatigue and fever.  Eyes:  Positive for visual disturbance.  Gastrointestinal:  Negative for abdominal pain, diarrhea, nausea and vomiting.  Endocrine: Positive for polydipsia and polyuria. Negative for polyphagia.  Genitourinary:  Positive for frequency. Negative for dysuria and urgency.     Physical Exam Triage Vital Signs ED Triage Vitals  Enc Vitals Group     BP 05/02/22 1742 (!) 132/91     Pulse Rate 05/02/22 1742 85     Resp 05/02/22 1742 17     Temp 05/02/22 1742 98.3 F (36.8 C)     Temp Source 05/02/22 1742 Oral     SpO2 05/02/22 1742 96 %     Weight --      Height --      Head Circumference --      Peak Flow --      Pain Score 05/02/22 1740 0     Pain Loc --      Pain Edu? --      Excl. in [place]? --    No data found.  Updated Vital Signs BP (!) 132/91 (BP Location: Left Arm)   Pulse 85   Temp 98.3 F (36.8 C) (Oral)   Resp 17   SpO2 96%   Visual Acuity Right Eye Distance:   Left Eye Distance:   Bilateral Distance:    Right Eye Near:   Left Eye Near:    Bilateral Near:     Physical Exam Vitals reviewed.  Constitutional:      General: He is awake.     Appearance: Normal appearance. He is well-developed. He is not ill-appearing.     Comments: Very pleasant male appears stated age in no acute distress sitting comfortably in exam room  HENT:     Head: Normocephalic and atraumatic.     Mouth/Throat:     Mouth: Mucous membranes are moist.     Pharynx: Uvula midline. No oropharyngeal exudate or posterior oropharyngeal erythema.  Cardiovascular:     Rate and Rhythm: Normal rate and regular rhythm.     Heart sounds: Normal heart sounds,  S1 normal and S2 normal. No murmur heard. Pulmonary:     Effort: Pulmonary effort is normal.     Breath sounds: Normal breath sounds. No stridor. No wheezing, rhonchi or rales.     Comments: Clear to auscultation bilaterally Abdominal:     General: Bowel sounds are normal.  Palpations: Abdomen is soft.     Tenderness: There is no abdominal tenderness.  Neurological:     Mental Status: He is alert.  Psychiatric:        Behavior: Behavior is cooperative.      UC Treatments / Results  Labs (all labs ordered are listed, but only abnormal results are displayed) Labs Reviewed  CBG MONITORING, ED - Abnormal; Notable for the following components:      Result Value   Glucose-Capillary 465 (*)    All other components within normal limits  POCT URINALYSIS DIPSTICK, ED / UC - Abnormal; Notable for the following components:   Glucose, UA >=1000 (*)    Ketones, ur 80 (*)    Hgb urine dipstick MODERATE (*)    Protein, ur 30 (*)    All other components within normal limits  COMPREHENSIVE METABOLIC PANEL  HEMOGLOBIN A1C    EKG   Radiology No results found.  Procedures Procedures (including critical care time)  Medications Ordered in UC Medications  sodium chloride 0.9 % bolus 1,000 mL (0 mLs Intravenous Stopped 05/02/22 1935)    Initial Impression / Assessment and Plan / UC Course  I have reviewed the triage vital signs and the nursing notes.  Pertinent labs & imaging results that were available during my care of the patient were reviewed by me and considered in my medical decision making (see chart for details).     Blood sugar was elevated at 465.  Patient is otherwise well-appearing, afebrile, nontoxic, nontachycardic, not tachypneic.  UA did show significant glucose urea with moderate ketones.  Initially discussed potential utility of going to the emergency room, however, patient reports he is feeling well and declined this.  He was given a liter of fluids with  improvement of symptoms.  Metformin was increased to 1000 mg twice daily and 10 units of Basaglar at night was added to medication regimen.  Patient was provided education on how to properly use a pen for injection of insulin during visit.  Discussed that he is to monitor his blood sugar closely particularly fasting blood sugar and keep a log so that we can adjust this medication.  He is to avoid carbohydrates and drink plenty of fluid.  We discussed signs/symptoms of a low blood sugar and how to address this.  He was provided prescription for glucometer.  He does not currently have a primary care so we will try to establish him with someone ASAP via PCP assistance.  Discussed that in the meantime he can return here for [place] to reevaluate him and address this medication.  Discussed that if he has any worsening or changing symptoms including shortness of breath, nausea, vomiting, weakness he needs to be seen immediately.  If his blood sugars remaining above 250 despite these interventions he needs to be reevaluated for medication adjustment.  Called and discussed case with Dr. Lanny Cramp who agreed with treatment plan.  Final Clinical Impressions(s) / UC Diagnoses   Final diagnoses:  Type 2 diabetes mellitus with hyperglycemia, without long-term current use of insulin [facility])     Discharge Instructions      I will contact you with your lab work if anything is abnormal tomorrow.  I have increased your metformin to 1000 mg twice daily.  Make sure to take this with food.  I have also added a bedtime insulin.  You should take 10 units at night.  Monitor your blood sugar particularly in the morning when you are fasting.  Keep  a log of this and follow-up with your primary care if you can within a few days so we can adjust this dose and your other medication.  If you develop any low blood sugars including blood sugar under 70 with nausea, vomiting, sweating, shakiness, hunger, dizziness you should eat a small meal and  drink 8 ounces of orange juice every 15 minutes until your blood sugar is above 80.  Someone will contact you to schedule an appoint with primary care.  You should be seen within the week if you are unable to see primary care in this timeframe please return here for reevaluation.  If you develop any shortness of breath, nausea, vomiting, weakness you need to go to the emergency room.  If your blood sugar is remaining above 250 he should return here so we can adjust your insulin.     ED Prescriptions     Medication Sig Dispense Auth. Provider   metFORMIN (GLUCOPHAGE) 500 MG tablet Take 2 tablets (1,000 mg total) by mouth 2 (two) times daily with a meal. 360 tablet Doreatha Offer K, PA-C   blood glucose meter kit and supplies KIT Dispense based on patient and insurance preference. Use up to four times daily as directed. 1 each Rebecah Dangerfield K, PA-C   Insulin Glargine (BASAGLAR KWIKPEN) 100 UNIT/ML Inject 10 Units into the skin at bedtime. 15 mL Anndrea Mihelich K, PA-C   Insulin Pen Needle (PEN NEEDLES) 30G X 8 MM MISC 1 Needle by Does not apply route at bedtime. 100 each Angelyse Heslin, Derry Skill, PA-C      PDMP not reviewed this encounter.   Terrilee Croak, PA-C 05/02/22 1955

## 2022-05-02 NOTE — Telephone Encounter (Signed)
Fax not yet received, labs in [facility], printed and given to NP

## 2022-05-02 NOTE — Discharge Instructions (Signed)
I will contact you with your lab work if anything is abnormal tomorrow.  I have increased your metformin to 1000 mg twice daily.  Make sure to take this with food.  I have also added a bedtime insulin.  You should take 10 units at night.  Monitor your blood sugar particularly in the morning when you are fasting.  Keep a log of this and follow-up with your primary care if you can within a few days so we can adjust this dose and your other medication.  If you develop any low blood sugars including blood sugar under 70 with nausea, vomiting, sweating, shakiness, hunger, dizziness you should eat a small meal and drink 8 ounces of orange juice every 15 minutes until your blood sugar is above 80.  Someone will contact you to schedule an appoint with primary care.  You should be seen within the week if you are unable to see primary care in this timeframe please return here for reevaluation.  If you develop any shortness of breath, nausea, vomiting, weakness you need to go to the emergency room.  If your blood sugar is remaining above 250 he should return here so we can adjust your insulin.

## 2022-05-02 NOTE — Telephone Encounter (Signed)
Called patient at the request of the NP, provided the following information to the patient. Patient verbalizes understanding and states that he has one more stop for work and then would report to the Urgent Care. Advised patient if labs need to be sent to the urgent care we can send them.       "Labs reviewed:   05/01/22:  wbc 6.8, Hb 12.6, Hct 40.7, PLt 220 Creatinine 1.67, GFR 49, glucose 542, Na 136, K 4.4, Ca 10.5, Mg 2.6   Discussed with Dr. Harrell Gave. His glucose (blood sugar) is markedly high. Needs to proceed to urgent care for evaluation of hyperglycemia including repeat lab work and urinalysis. May need insulin for management of blood sugar. If labs remain markedly abnormal will likely need evaluation in the emergency room. But may start at urgent care if he wishes.    Loel Dubonnet, NP"

## 2022-05-06 ENCOUNTER — Ambulatory Visit (HOSPITAL_COMMUNITY)
Admission: RE | Admit: 2022-05-06 | Discharge: 2022-05-06 | Disposition: A | Discharge status: Home / Self Care | Payer: Self-pay | Source: Ambulatory Visit | Attending: Family Medicine | Admitting: Family Medicine

## 2022-05-06 ENCOUNTER — Encounter (HOSPITAL_COMMUNITY): Payer: Self-pay

## 2022-05-06 VITALS — BP 145/88 | HR 83 | Temp 99.4°F | Resp 16

## 2022-05-06 DIAGNOSIS — E1165 Type 2 diabetes mellitus with hyperglycemia: Secondary | ICD-10-CM

## 2022-05-06 DIAGNOSIS — Z794 Long term (current) use of insulin: Secondary | ICD-10-CM

## 2022-05-06 MED ORDER — BASAGLAR KWIKPEN 100 UNIT/ML ~~LOC~~ SOPN: 20.0000 [IU] | PEN_INJECTOR | Freq: Every day | SUBCUTANEOUS | 0 refills | Status: DC

## 2022-05-06 NOTE — ED Provider Notes (Signed)
[facility]    CSN: [id number] Arrival date & time: 05/06/22  5573      History   Chief Complaint Chief Complaint  Patient presents with   Blood Sugar Problem    Blood sugar dangerously elevated. - Entered by patient   Follow-up    HPI Cameron Wells is a 52 y.o. male.   Patient is here for f/u diabetes (see previous UC note from 05/02/22) He is doing well overall.  He feels he has more energy.  His BS have been coming down.  One reading that was in the 200s, but usually all in the 300s.  He has been watching his diet and eating better.   He does have an appointment with a PCP at the end of February.  He has not returned to work.  He does get brief moments of dizziness with activity.  This has been going on since starting medication.  He would like a note for work.        Past Medical History:  Diagnosis Date   Allergy    Anxiety    Asthma    DM (diabetes mellitus) (HCC)    Hyperlipidemia    Hypertension    Panic attack     Patient Active Problem List   Diagnosis Date Noted   Newly diagnosed diabetes (HCC) 01/03/2020   Dyslipidemia 04/15/2018   Adjustment disorder with mixed anxiety and depressed mood 08/10/2014   Panic attack 08/10/2014   Uncontrolled hypertension 06/19/2014    History reviewed. No pertinent surgical history.     Home Medications    Prior to Admission medications   Medication Sig Start Date End Date Taking? Authorizing Provider  amLODipine (NORVASC) 10 MG tablet Take 1 tablet (10 mg total) by mouth daily. 03/06/22 03/01/23  Alver Sorrow, NP  aspirin EC 81 MG tablet Take 1 tablet (81 mg total) by mouth daily. Swallow whole. 02/24/22   Alver Sorrow, NP  blood glucose meter kit and supplies KIT Dispense based on patient and insurance preference. Use up to four times daily as directed. 05/02/22   Raspet, Noberto Retort, PA-C  carvedilol (COREG) 12.5 MG tablet Take 1 tablet (12.5 mg total) by mouth 2 (two) times daily. 05/01/22 04/26/23   Alver Sorrow, NP  cetirizine (ZYRTEC) 10 MG tablet TAKE 1 TABLET BY MOUTH ONCE DAILY ** COURTESY REFILL. NEEDS OFFICE VISIT AND LABS BEFORE FURTHER REFILLS** 09/19/21   Janeece Agee, NP  EPINEPHrine 0.3 mg/0.3 mL IJ SOAJ injection Inject 0.3 mg into the muscle as needed for anaphylaxis. 03/13/21   Long, Arlyss Repress, MD  FLUoxetine (PROZAC) 40 MG capsule Take 1 capsule (40 mg total) by mouth daily. 11/20/21   Janeece Agee, NP  fluticasone Aleda Grana) 50 MCG/ACT nasal spray Use 2 spray(s) in each nostril once daily 07/04/21   Janeece Agee, NP  Insulin Glargine [facility]) 100 UNIT/ML Inject 10 Units into the skin at bedtime. 05/02/22   Raspet, Noberto Retort, PA-C  Insulin Pen Needle (PEN NEEDLES) 30G X 8 MM MISC 1 Needle by Does not apply route at bedtime. 05/02/22   Raspet, Noberto Retort, PA-C  metFORMIN (GLUCOPHAGE) 500 MG tablet Take 2 tablets (1,000 mg total) by mouth 2 (two) times daily with a meal. 05/02/22   Raspet, Kert Shackett K, PA-C  nitroGLYCERIN (NITROSTAT) 0.4 MG SL tablet Place 1 tablet (0.4 mg total) under the tongue every 5 (five) minutes as needed for chest pain. 02/24/22 05/25/22  Alver Sorrow, NP  omeprazole (PRILOSEC  OTC) 20 MG tablet Take 20 mg by mouth daily.    [provider]  valsartan-hydrochlorothiazide (DIOVAN-HCT) 320-25 MG tablet Take 1 tablet by mouth daily. 01/23/22   Loel Dubonnet, NP    Family History Family History  Problem Relation Age of Onset   CAD Maternal Grandmother    Cancer Paternal Grandfather    Hypertension Maternal Aunt     Social History Social History   Tobacco Use   Smoking status: Former    Types: Cigarettes    Quit date: 2011    Years since quitting: 13.0   Smokeless tobacco: Never  Vaping Use   Vaping Use: Never used  Substance Use Topics   Alcohol use: Yes    Alcohol/week: 0.0 standard drinks of alcohol   Drug use: No     Allergies   Penicillins   Review of Systems Review of Systems  Constitutional: Negative.   HENT:  Negative.    Cardiovascular: Negative.   Gastrointestinal: Negative.   Musculoskeletal: Negative.   Neurological:  Positive for dizziness.  Psychiatric/Behavioral: Negative.       Physical Exam Triage Vital Signs ED Triage Vitals [05/06/22 1019]  Enc Vitals Group     BP (!) 145/88     Pulse Rate 83     Resp 16     Temp 99.4 F (37.4 C)     Temp Source Oral     SpO2 96 %     Weight      Height      Head Circumference      Peak Flow      Pain Score 0     Pain Loc      Pain Edu?      Excl. in [place]?    No data found.  Updated Vital Signs BP (!) 145/88 (BP Location: Right Arm)   Pulse 83   Temp 99.4 F (37.4 C) (Oral)   Resp 16   SpO2 96%   Visual Acuity Right Eye Distance:   Left Eye Distance:   Bilateral Distance:    Right Eye Near:   Left Eye Near:    Bilateral Near:     Physical Exam Constitutional:      Appearance: Normal appearance.  Cardiovascular:     Rate and Rhythm: Normal rate.  Pulmonary:     Effort: Pulmonary effort is normal.  Skin:    General: Skin is warm.  Neurological:     General: No focal deficit present.     Mental Status: He is alert.  Psychiatric:        Mood and Affect: Mood normal.      UC Treatments / Results  Labs (all labs ordered are listed, but only abnormal results are displayed) Labs Reviewed - No data to display  EKG   Radiology No results found.  Procedures Procedures (including critical care time)  Medications Ordered in UC Medications - No data to display  Initial Impression / Assessment and Plan / UC Course  I have reviewed the triage vital signs and the nursing notes.  Pertinent labs & imaging results that were available during my care of the patient were reviewed by me and considered in my medical decision making (see chart for details).  Patient seen today for follow up diabetes.  BS this morning was 385 at home, feeling well other than episodic dizziness.  Today I will increase insulin to 20  units at bedtime. He states he has plenty at home and does  not need this sent to the pharmacy.  Plan to f/u here on Monday for check in.  Discussed that the goal is to get his BS in a safe zone, rather than controlled.  If BS are looking better then likely will just follow up with his pcp in February as scheduled.  He may return sooner if he has any issues.  The nurse coordinator will call him on Friday to check in before the weekend.    Final Clinical Impressions(s) / UC Diagnoses   Final diagnoses:  Type 2 diabetes mellitus with hyperglycemia, with long-term current use of insulin [facility])     Discharge Instructions      You were seen for follow up diabetes.  Please increase your insulin to 20 units at bedtime.  Continue your other medications as is.  Continue to monitor your diet.  Please return on Monday 05/12/22 for recheck of your blood sugar.  If you have any other issues/concerns before then please return for re-evaluation.     ED Prescriptions   None    PDMP not reviewed this encounter.   Rondel Oh, MD 05/06/22 1100

## 2022-05-06 NOTE — Discharge Instructions (Signed)
You were seen for follow up diabetes.  Please increase your insulin to 20 units at bedtime.  Continue your other medications as is.  Continue to monitor your diet.  Please return on Monday 05/12/22 for recheck of your blood sugar.  If you have any other issues/concerns before then please return for re-evaluation.

## 2022-05-06 NOTE — ED Triage Notes (Addendum)
Here for follow up appointment. Katie, call-back nurse, in room with patient at the moment navigating care and setting up PCP visit. Refer to previous visit note for plan of care for patient. Last CBG 385 this morning

## 2022-05-09 ENCOUNTER — Telehealth (HOSPITAL_COMMUNITY): Payer: Self-pay | Admitting: Emergency Medicine

## 2022-05-09 NOTE — Telephone Encounter (Signed)
Follow up call to patient, LVM

## 2022-06-19 ENCOUNTER — Ambulatory Visit (INDEPENDENT_AMBULATORY_CARE_PROVIDER_SITE_OTHER): Payer: Self-pay | Admitting: Family Medicine

## 2022-06-19 ENCOUNTER — Encounter: Payer: Self-pay | Admitting: Family Medicine

## 2022-06-19 VITALS — BP 144/90 | HR 70 | Temp 97.8°F | Ht 71.0 in | Wt 214.0 lb

## 2022-06-19 DIAGNOSIS — I10 Essential (primary) hypertension: Secondary | ICD-10-CM

## 2022-06-19 DIAGNOSIS — Z1211 Encounter for screening for malignant neoplasm of colon: Secondary | ICD-10-CM

## 2022-06-19 DIAGNOSIS — E785 Hyperlipidemia, unspecified: Secondary | ICD-10-CM

## 2022-06-19 DIAGNOSIS — E119 Type 2 diabetes mellitus without complications: Secondary | ICD-10-CM

## 2022-06-19 DIAGNOSIS — Z889 Allergy status to unspecified drugs, medicaments and biological substances status: Secondary | ICD-10-CM

## 2022-06-19 LAB — CBC WITH DIFFERENTIAL/PLATELET
Basophils Absolute: 0 10*3/uL (ref 0.0–0.1)
Basophils Relative: 1 % (ref 0.0–3.0)
Eosinophils Absolute: 0.3 10*3/uL (ref 0.0–0.7)
Eosinophils Relative: 6.5 % — ABNORMAL HIGH (ref 0.0–5.0)
HCT: 39.3 % (ref 39.0–52.0)
Hemoglobin: 13 g/dL (ref 13.0–17.0)
Lymphocytes Relative: 32.4 % (ref 12.0–46.0)
Lymphs Abs: 1.5 10*3/uL (ref 0.7–4.0)
MCHC: 33.1 g/dL (ref 30.0–36.0)
MCV: 93.2 fl (ref 78.0–100.0)
Monocytes Absolute: 0.4 10*3/uL (ref 0.1–1.0)
Monocytes Relative: 8.2 % (ref 3.0–12.0)
Neutro Abs: 2.4 10*3/uL (ref 1.4–7.7)
Neutrophils Relative %: 51.9 % (ref 43.0–77.0)
Platelets: 219 10*3/uL (ref 150.0–400.0)
RBC: 4.22 Mil/uL (ref 4.22–5.81)
RDW: 14.8 % (ref 11.5–15.5)
WBC: 4.5 10*3/uL (ref 4.0–10.5)

## 2022-06-19 LAB — LIPID PANEL
Cholesterol: 197 mg/dL (ref 0–200)
HDL: 37.7 mg/dL — ABNORMAL LOW (ref >39.00)
LDL Cholesterol: 142 mg/dL — ABNORMAL HIGH (ref 0–99)
NonHDL: 159.16
Total CHOL/HDL Ratio: 5
Triglycerides: 85 mg/dL (ref 0.0–149.0)
VLDL: 17 mg/dL (ref 0.0–40.0)

## 2022-06-19 LAB — COMPREHENSIVE METABOLIC PANEL
ALT: 8 U/L (ref 0–53)
AST: 16 U/L (ref 0–37)
Albumin: 4.2 g/dL (ref 3.5–5.2)
Alkaline Phosphatase: 38 U/L — ABNORMAL LOW (ref 39–117)
BUN: 13 mg/dL (ref 6–23)
CO2: 28 mEq/L (ref 19–32)
Calcium: 9.8 mg/dL (ref 8.4–10.5)
Chloride: 107 mEq/L (ref 96–112)
Creatinine, Ser: 1.2 mg/dL (ref 0.40–1.50)
GFR: 69.4 mL/min (ref >60.00)
Glucose, Bld: 86 mg/dL (ref 70–99)
Potassium: 4.1 mEq/L (ref 3.5–5.1)
Sodium: 142 mEq/L (ref 135–145)
Total Bilirubin: 0.5 mg/dL (ref 0.2–1.2)
Total Protein: 7.2 g/dL (ref 6.0–8.3)

## 2022-06-19 LAB — TSH: TSH: 0.97 u[IU]/mL (ref 0.35–5.50)

## 2022-06-19 MED ORDER — FLUTICASONE PROPIONATE 50 MCG/ACT NA SUSP: NASAL | 0 refills | Status: DC

## 2022-06-19 MED ORDER — BASAGLAR KWIKPEN 100 UNIT/ML ~~LOC~~ SOPN: 20.0000 [IU] | PEN_INJECTOR | Freq: Every day | SUBCUTANEOUS | 0 refills | Status: DC

## 2022-06-19 MED ORDER — ROSUVASTATIN CALCIUM 20 MG PO TABS: 20.0000 mg | ORAL_TABLET | Freq: Every day | ORAL | 3 refills | Status: DC

## 2022-06-19 NOTE — Assessment & Plan Note (Signed)
Flonase prescribed per request.

## 2022-06-19 NOTE — Assessment & Plan Note (Signed)
This will be his first colon cancer screening. Prefers colonoscopy to Cologuard.

## 2022-06-19 NOTE — Progress Notes (Signed)
New Patient Office Visit  Subjective    Patient ID: Cameron Wells, male    DOB: 1970/08/21  Age: 52 y.o. MRN: [id number]  CC:  Chief Complaint  Patient presents with   Diabetes    New onset Type II Diabetes and would like to discuss;    Establish Care    HPI [place] presents to establish care Maximiano Coss and [place]born Coho.   Other providers:  Cardiologist  HTN clinic   New diabetes diagnosis. Taking metformin 1,000 mg bid and Basaglar 20 units daily.  BS readings between 50s-150s. He has made significant healthy diet changes.   HTN- taking valsartan-HCTZ 320-25 mg, amlodipine 10 mg, and carvedilol 12.5 mg.   On aspirin per cardiologist   HL- he was taking a statin but stopped the medication. Crestor 40 mg in past   OSA- uses CPAP  Anxiety and depression- used to take Prozac   Denies fever, chills, dizziness, chest pain, palpitations, shortness of breath, abdominal pain, N/V/D, urinary symptoms, LE edema.   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      06/19/2022   11:38 AM 11/20/2021   10:57 AM 02/01/2021    3:03 PM 12/16/2019    1:09 PM 04/27/2019    2:24 PM  Depression screen PHQ 2/9  Decreased Interest 0 0 0 0 0  Down, Depressed, Hopeless 0 0 0 0 0  PHQ - 2 Score 0 0 0 0 0  Altered sleeping  0 0    Tired, decreased energy  0 1    Change in appetite  0 0    Feeling bad or failure about yourself   0 0    Trouble concentrating  0 0    Moving slowly or fidgety/restless  0 0    Suicidal thoughts  0 0    PHQ-9 Score  0 1       Outpatient Encounter Medications as of 06/19/2022  Medication Sig   ACCU-CHEK GUIDE test strip USE UP TO FOUR TIMES DAILY AS DIRECTED   Accu-Chek Softclix Lancets lancets SMARTSIG:Topical 1-4 Times Daily   amLODipine (NORVASC) 10 MG tablet Take 1 tablet (10 mg total) by mouth daily.   aspirin EC 81 MG tablet Take 1 tablet (81 mg total) by mouth daily. Swallow whole.   blood glucose meter kit  and supplies KIT Dispense based on patient and insurance preference. Use up to four times daily as directed.   carvedilol (COREG) 12.5 MG tablet Take 1 tablet (12.5 mg total) by mouth 2 (two) times daily.   cetirizine (ZYRTEC) 10 MG tablet TAKE 1 TABLET BY MOUTH ONCE DAILY ** COURTESY REFILL. NEEDS OFFICE VISIT AND LABS BEFORE FURTHER REFILLS**   EPINEPHrine 0.3 mg/0.3 mL IJ SOAJ injection Inject 0.3 mg into the muscle as needed for anaphylaxis.   Insulin Pen Needle (PEN NEEDLES) 30G X 8 MM MISC 1 Needle by Does not apply route at bedtime.   metFORMIN (GLUCOPHAGE) 500 MG tablet Take 2 tablets (1,000 mg total) by mouth 2 (two) times daily with a meal.   nitroGLYCERIN (NITROSTAT) 0.4 MG SL tablet Place 1 tablet (0.4 mg total) under the tongue every 5 (five) minutes as needed for chest pain.   omeprazole (PRILOSEC OTC) 20 MG tablet Take 20 mg by mouth daily.   rosuvastatin (CRESTOR) 20 MG tablet Take 1 tablet (20 mg total) by mouth daily.   valsartan-hydrochlorothiazide (DIOVAN-HCT) 320-25 MG tablet Take 1 tablet by mouth  daily.   [DISCONTINUED] fluticasone (FLONASE) 50 MCG/ACT nasal spray Use 2 spray(s) in each nostril once daily   [DISCONTINUED] Insulin Glargine (BASAGLAR KWIKPEN) 100 UNIT/ML Inject 20 Units into the skin at bedtime.   fluticasone (FLONASE) 50 MCG/ACT nasal spray Use 2 spray(s) in each nostril once daily   Insulin Glargine (BASAGLAR KWIKPEN) 100 UNIT/ML Inject 20 Units into the skin at bedtime.   [DISCONTINUED] FLUoxetine (PROZAC) 40 MG capsule Take 1 capsule (40 mg total) by mouth daily. (Patient not taking: Reported on 06/19/2022)   No facility-administered encounter medications on file as of 06/19/2022.    Past Medical History:  Diagnosis Date   Allergy    Anxiety    Asthma    DM (diabetes mellitus) ([place])    Hyperlipidemia    Hypertension    Panic attack     History reviewed. No pertinent surgical history.  Family History  Problem Relation Age of Onset   CAD  Maternal Grandmother    Cancer Paternal Grandfather    Hypertension Maternal Aunt     Social History   Socioeconomic History   Marital status: Married    Spouse name: Not on file   Number of children: Not on file   Years of education: Not on file   Highest education level: Not on file  Occupational History   Not on file  Tobacco Use   Smoking status: Former    Types: Cigarettes    Quit date: 2011    Years since quitting: 13.1   Smokeless tobacco: Never  Vaping Use   Vaping Use: Never used  Substance and Sexual Activity   Alcohol use: Yes    Alcohol/week: 0.0 standard drinks of alcohol   Drug use: No   Sexual activity: Not on file  Other Topics Concern   Not on file  Social History Narrative   Not on file   Social Determinants of Health   Financial Resource Strain: Low Risk  (01/31/2022)   Overall Financial Resource Strain (CARDIA)    Difficulty of Paying Living Expenses: Not very hard  Food Insecurity: No Food Insecurity (01/31/2022)   Hunger Vital Sign    Worried About Running Out of Food in the Last Year: Never true    Ran Out of Food in the Last Year: Never true  Transportation Needs: No Transportation Needs (01/31/2022)   PRAPARE - Hydrologist (Medical): No    Lack of Transportation (Non-Medical): No  Physical Activity: Insufficiently Active (01/23/2022)   Exercise Vital Sign    Days of Exercise per Week: 2 days    Minutes of Exercise per Session: 30 min  Stress: No Stress Concern Present (01/23/2022)   [place]    Feeling of Stress : Only a little  Social Connections: Socially Isolated (01/23/2022)   Social Connection and Isolation Panel [NHANES]    Frequency of Communication with Friends and Family: Twice a week    Frequency of Social Gatherings with Friends and Family: Never    Attends Religious Services: Never    Marine scientist or Organizations: No     Attends Music therapist: Never    Marital Status: Married  Human resources officer Violence: Not on file    ROS      Objective    BP (!) 144/90   Pulse 70   Temp 97.8 F (36.6 C)   Ht '5\' 11"'$  (1.803 m)   Wt 214 lb (  97.1 kg)   SpO2 100%   BMI 29.85 kg/m   Physical Exam Constitutional:      General: He is not in acute distress.    Appearance: He is not ill-appearing.  Eyes:     Extraocular Movements: Extraocular movements intact.     Pupils: Pupils are equal, round, and reactive to light.  Cardiovascular:     Rate and Rhythm: Normal rate and regular rhythm.  Pulmonary:     Effort: Pulmonary effort is normal.     Breath sounds: Normal breath sounds.  Musculoskeletal:     Right lower leg: No edema.     Left lower leg: No edema.  Skin:    General: Skin is warm and dry.  Neurological:     General: No focal deficit present.     Mental Status: He is alert and oriented to person, place, and time.  Psychiatric:        Mood and Affect: Mood normal.        Behavior: Behavior normal.        Thought Content: Thought content normal.         Assessment & Plan:   Problem List Items Addressed This Visit       Cardiovascular and Mediastinum   Uncontrolled hypertension - Primary    Not at goal. Continue current medications and f/u with HTN clinic      Relevant Medications   rosuvastatin (CRESTOR) 20 MG tablet   Other Relevant Orders   CBC with Differential/Platelet (Completed)   Comprehensive metabolic panel (Completed)   TSH (Completed)     Endocrine   Newly diagnosed diabetes ([place])    Discussed hypoglycemia prevention.  He will begin to titrate Basaglar down 2 units if fasting blood sugars less than 120.  Continue metformin 1000 mg twice daily.  Continue monitoring blood sugars closely.  Continue with healthy diet and exercise. Start back on statin. Check labs including lipids.  Follow up in 6-7 weeks      Relevant Medications   Insulin Glargine  (BASAGLAR KWIKPEN) 100 UNIT/ML   rosuvastatin (CRESTOR) 20 MG tablet   Other Relevant Orders   CBC with Differential/Platelet (Completed)   Comprehensive metabolic panel (Completed)   TSH (Completed)     Other   Dyslipidemia    Check lipids. Stopped statin therapy for unknown reason. Recommend starting back on Crestor.       Relevant Medications   rosuvastatin (CRESTOR) 20 MG tablet   Other Relevant Orders   Lipid panel (Completed)   Multiple allergies    Flonase prescribed per request.       Relevant Medications   fluticasone (FLONASE) 50 MCG/ACT nasal spray   Screen for colon cancer    This will be his first colon cancer screening. Prefers colonoscopy to Cologuard.       Relevant Orders   Ambulatory referral to Gastroenterology    Return in about 7 weeks (around 08/07/2022) for Diabetes .   Harland Dingwall, NP-C

## 2022-06-19 NOTE — Assessment & Plan Note (Addendum)
Discussed hypoglycemia prevention.  He will begin to titrate Basaglar down 2 units if fasting blood sugars less than 120.  Continue metformin 1000 mg twice daily.  Continue monitoring blood sugars closely.  Continue with healthy diet and exercise. Start back on statin. Check labs including lipids.  Follow up in 6-7 weeks

## 2022-06-19 NOTE — Assessment & Plan Note (Signed)
Check lipids. Stopped statin therapy for unknown reason. Recommend starting back on Crestor.

## 2022-06-19 NOTE — Assessment & Plan Note (Signed)
Not at goal. Continue current medications and f/u with HTN clinic

## 2022-06-19 NOTE — Patient Instructions (Signed)
Thank you for trusting [place] with your health care.  Please go downstairs for labs before you leave.  Continue your current medications.  Titrate your insulin, Basaglar, as discussed.  If your fasting blood sugar is less than 120 then reduce the insulin by 2 units every second day.  When you get to 10 units of Basaglar, you can stay there until I see you again unless you have fasting blood sugars less than 80.  Call if you have any questions about this    DASH Eating Plan DASH stands for Dietary Approaches to Stop Hypertension. The DASH eating plan is a healthy eating plan that has been shown to: Reduce high blood pressure (hypertension). Reduce your risk for type 2 diabetes, heart disease, and stroke. Help with weight loss. What are tips for following this plan? Reading food labels Check food labels for the amount of salt (sodium) per serving. Choose foods with less than 5 percent of the Daily Value of sodium. Generally, foods with less than 300 milligrams (mg) of sodium per serving fit into this eating plan. To find whole grains, look for the word "whole" as the first word in the ingredient list. Shopping Buy products labeled as "low-sodium" or "no salt added." Buy fresh foods. Avoid canned foods and pre-made or frozen meals. Cooking Avoid adding salt when cooking. Use salt-free seasonings or herbs instead of table salt or sea salt. Check with your health care provider or pharmacist before using salt substitutes. Do not fry foods. Cook foods using healthy methods such as baking, boiling, grilling, roasting, and broiling instead. Cook with heart-healthy oils, such as olive, canola, avocado, soybean, or sunflower oil. Meal planning  Eat a balanced diet that includes: 4 or more servings of fruits and 4 or more servings of vegetables each day. Try to fill one-half of your plate with fruits and vegetables. 6-8 servings of whole grains each day. Less than 6 oz (170 g) of lean meat,  poultry, or fish each day. A 3-oz (85-g) serving of meat is about the same size as a deck of cards. One egg equals 1 oz (28 g). 2-3 servings of low-fat dairy each day. One serving is 1 cup (237 mL). 1 serving of nuts, seeds, or beans 5 times each week. 2-3 servings of heart-healthy fats. Healthy fats called omega-3 fatty acids are found in foods such as walnuts, flaxseeds, fortified milks, and eggs. These fats are also found in cold-water fish, such as sardines, salmon, and mackerel. Limit how much you eat of: Canned or prepackaged foods. Food that is high in trans fat, such as some fried foods. Food that is high in saturated fat, such as fatty meat. Desserts and other sweets, sugary drinks, and other foods with added sugar. Full-fat dairy products. Do not salt foods before eating. Do not eat more than 4 egg yolks a week. Try to eat at least 2 vegetarian meals a week. Eat more home-cooked food and less restaurant, buffet, and fast food. Lifestyle When eating at a restaurant, ask that your food be prepared with less salt or no salt, if possible. If you drink alcohol: Limit how much you use to: 0-1 drink a day for women who are not pregnant. 0-2 drinks a day for men. Be aware of how much alcohol is in your drink. In the [place] one drink equals one 12 oz bottle of beer (355 mL), one 5 oz glass of wine (148 mL), or one 1 oz glass of hard liquor (44 mL). General  information Avoid eating more than 2,300 mg of salt a day. If you have hypertension, you may need to reduce your sodium intake to 1,500 mg a day. Work with your health care provider to maintain a healthy body weight or to lose weight. Ask what an ideal weight is for you. Get at least 30 minutes of exercise that causes your heart to beat faster (aerobic exercise) most days of the week. Activities may include walking, swimming, or biking. Work with your health care provider or dietitian to adjust your eating plan to your individual calorie  needs. What foods should I eat? Fruits All fresh, dried, or frozen fruit. Canned fruit in natural juice (without added sugar). Vegetables Fresh or frozen vegetables (raw, steamed, roasted, or grilled). Low-sodium or reduced-sodium tomato and vegetable juice. Low-sodium or reduced-sodium tomato sauce and tomato paste. Low-sodium or reduced-sodium canned vegetables. Grains Whole-grain or whole-wheat bread. Whole-grain or whole-wheat pasta. Brown rice. [place]. Bulgur. Whole-grain and low-sodium cereals. Pita bread. Low-fat, low-sodium crackers. Whole-wheat flour tortillas. Meats and other proteins Skinless chicken or [place]. Ground chicken or [place]. Pork with fat trimmed off. Fish and seafood. Egg whites. Dried beans, peas, or lentils. Unsalted nuts, nut butters, and seeds. Unsalted canned beans. Lean cuts of beef with fat trimmed off. Low-sodium, lean precooked or cured meat, such as sausages or meat loaves. Dairy Low-fat (1%) or fat-free (skim) milk. Reduced-fat, low-fat, or fat-free cheeses. Nonfat, low-sodium ricotta or cottage cheese. Low-fat or nonfat yogurt. Low-fat, low-sodium cheese. Fats and oils Soft margarine without trans fats. Vegetable oil. Reduced-fat, low-fat, or light mayonnaise and salad dressings (reduced-sodium). Canola, safflower, olive, avocado, soybean, and sunflower oils. Avocado. Seasonings and condiments Herbs. Spices. Seasoning mixes without salt. Other foods Unsalted popcorn and pretzels. Fat-free sweets. The items listed above may not be a complete list of foods and beverages you can eat. Contact a dietitian for more information. What foods should I avoid? Fruits Canned fruit in a light or heavy syrup. Fried fruit. Fruit in cream or butter sauce. Vegetables Creamed or fried vegetables. Vegetables in a cheese sauce. Regular canned vegetables (not low-sodium or reduced-sodium). Regular canned tomato sauce and paste (not low-sodium or reduced-sodium). Regular  tomato and vegetable juice (not low-sodium or reduced-sodium). Angie Fava. Olives. Grains Baked goods made with fat, such as croissants, muffins, or some breads. Dry pasta or rice meal packs. Meats and other proteins Fatty cuts of meat. Ribs. Fried meat. Berniece Salines. Bologna, salami, and other precooked or cured meats, such as sausages or meat loaves. Fat from the back of a pig (fatback). Bratwurst. Salted nuts and seeds. Canned beans with added salt. Canned or smoked fish. Whole eggs or egg yolks. Chicken or [place] with skin. Dairy Whole or 2% milk, cream, and half-and-half. Whole or full-fat cream cheese. Whole-fat or sweetened yogurt. Full-fat cheese. Nondairy creamers. Whipped toppings. Processed cheese and cheese spreads. Fats and oils Butter. Stick margarine. Lard. Shortening. Ghee. Bacon fat. Tropical oils, such as coconut, palm kernel, or palm oil. Seasonings and condiments Onion salt, garlic salt, seasoned salt, table salt, and sea salt. Worcestershire sauce. Tartar sauce. Barbecue sauce. Teriyaki sauce. Soy sauce, including reduced-sodium. Steak sauce. Canned and packaged gravies. Fish sauce. Oyster sauce. Cocktail sauce. Store-bought horseradish. Ketchup. Mustard. Meat flavorings and tenderizers. Bouillon cubes. Hot sauces. Pre-made or packaged marinades. Pre-made or packaged taco seasonings. Relishes. Regular salad dressings. Other foods Salted popcorn and pretzels. The items listed above may not be a complete list of foods and beverages you should avoid. Contact a dietitian  for more information. Where to find more information National Heart, Lung, and Blood Institute: https://wilson-eaton.com/ American Heart Association: www.heart.org Academy of Nutrition and Dietetics: www.eatright.[place]: www.kidney.org Summary The DASH eating plan is a healthy eating plan that has been shown to reduce high blood pressure (hypertension). It may also reduce your risk for type 2 diabetes,  heart disease, and stroke. When on the DASH eating plan, aim to eat more fresh fruits and vegetables, whole grains, lean proteins, low-fat dairy, and heart-healthy fats. With the DASH eating plan, you should limit salt (sodium) intake to 2,300 mg a day. If you have hypertension, you may need to reduce your sodium intake to 1,500 mg a day. Work with your health care provider or dietitian to adjust your eating plan to your individual calorie needs. This information is not intended to replace advice given to you by your health care provider. Make sure you discuss any questions you have with your health care provider. Document Revised: 03/11/2019 Document Reviewed: 03/11/2019 Elsevier Patient Education  [place].

## 2022-07-09 ENCOUNTER — Ambulatory Visit (INDEPENDENT_AMBULATORY_CARE_PROVIDER_SITE_OTHER): Payer: Self-pay | Admitting: Cardiovascular Disease

## 2022-07-09 ENCOUNTER — Encounter (HOSPITAL_BASED_OUTPATIENT_CLINIC_OR_DEPARTMENT_OTHER): Payer: Self-pay | Admitting: Cardiovascular Disease

## 2022-07-09 VITALS — BP 128/82 | HR 78 | Ht 71.0 in | Wt 208.0 lb

## 2022-07-09 DIAGNOSIS — E785 Hyperlipidemia, unspecified: Secondary | ICD-10-CM

## 2022-07-09 DIAGNOSIS — I10 Essential (primary) hypertension: Secondary | ICD-10-CM

## 2022-07-09 DIAGNOSIS — I2510 Atherosclerotic heart disease of native coronary artery without angina pectoris: Secondary | ICD-10-CM

## 2022-07-09 DIAGNOSIS — I2584 Coronary atherosclerosis due to calcified coronary lesion: Secondary | ICD-10-CM

## 2022-07-09 NOTE — Patient Instructions (Signed)
Medication Instructions:  Your physician recommends that you continue on your current medications as directed. Please refer to the Current Medication list given to you today.   Labwork: NONE  Testing/Procedures: NONE  Follow-Up: 01/13/2023 1:30 PM WITH DR [facility]   If you need a refill on your cardiac medications before your next appointment, please call your pharmacy.

## 2022-07-09 NOTE — Progress Notes (Signed)
Advanced Hypertension Clinic Follow-up:    Date:  08/25/2022   ID:  Cameron Wells, DOB [birth year], MRN [id number]  PCP:  Avanell Shackleton, NP-C  Cardiologist:  None  Nephrologist:  Referring MD: No ref. provider found   CC: Hypertension  History of Present Illness:    Cameron Wells is a 53 y.o. male with a hx of hypertension, hyperlipidemia, diabetes, asthma, CKD IIIa, OSA on CPAP, and anxiety, here for follow-up. He had initially been diagnosed with hypertension in his 30s. On 11/20/2021 he had seen his PCP and his blood pressure was uncontrolled (172/100) despite a regimen of Losartan-HCTZ 100-25mg  QD, and Toprol 100mg  QD. He was referred to the [facility] and seen by Gillian Shields, NP on 01/23/2022. At that visit his blood pressure was 199/123. Losartan-HCTZ and metoprolol were discontinued. He was started on valsartan-HCTZ 320-25 mg daily and carvedilol 25 mg BID. He was referred to [facility].  At his visit 04/2022 carvedilol was reduced from 12.5 mg to due to bradycardia and fatigue. Labs at that visit showed a blood sugar over 500 and he was referred to urgent care and a new PCP. We recommended increasing his insulin. He had a coronary calcium score 02/2022 with a score of 13.7 which was 79th percentile. Renal dopplers were normal. Catecholamines, metanephrines, and cortisol were ordered but never collected. Labs for hyperaldosteronism were indeterminate in 2021.  Today, he states he is feeling awesome. His blood pressure is well controlled in clinic at 128/82. At home his readings average in the 130s systolic and , rarely 145 over 80s-90s diastolic. His home blood sugars have consistently ranged in the 70s-90s. He has done will with diet and lifestyle changes. For at least a month he is following a full mediterranean/vegetarian diet and is staying active with walking the dog or playing with his kids outside for an hour.  In April he will follow-up with his PCP with plans to recheck his  cholesterol. Previously he had tried rosuvastatin but didn't like the way it made him feel. He noted soreness at the time. Lately he feels more agile especially in his joints. Additionally, he complains of a "bump" located near the navel. On exam this is consistent with an umbilical hernia. He denies any palpitations, chest pain, shortness of breath, or peripheral edema. No lightheadedness, headaches, syncope, orthopnea, or PND.  Previous antihypertensives:   Past Medical History:  Diagnosis Date   Allergy    Anxiety    Asthma    Coronary artery calcification 08/25/2022   DM (diabetes mellitus) (HCC)    Essential hypertension 06/19/2014   Dx 2010.     Hyperlipidemia    Hypertension    Panic attack     History reviewed. No pertinent surgical history.  Current Medications: Current Meds  Medication Sig   ACCU-CHEK GUIDE test strip USE UP TO FOUR TIMES DAILY AS DIRECTED   Accu-Chek Softclix Lancets lancets SMARTSIG:Topical 1-4 Times Daily   amLODipine (NORVASC) 10 MG tablet Take 1 tablet (10 mg total) by mouth daily.   aspirin EC 81 MG tablet Take 1 tablet (81 mg total) by mouth daily. Swallow whole.   blood glucose meter kit and supplies KIT Dispense based on patient and insurance preference. Use up to four times daily as directed.   carvedilol (COREG) 12.5 MG tablet Take 1 tablet (12.5 mg total) by mouth 2 (two) times daily.   cetirizine (ZYRTEC) 10 MG tablet TAKE 1 TABLET BY MOUTH ONCE DAILY ** COURTESY REFILL. NEEDS  OFFICE VISIT AND LABS BEFORE FURTHER REFILLS**   EPINEPHrine 0.3 mg/0.3 mL IJ SOAJ injection Inject 0.3 mg into the muscle as needed for anaphylaxis.   fluticasone (FLONASE) 50 MCG/ACT nasal spray Use 2 spray(s) in each nostril once daily   Insulin Glargine (BASAGLAR KWIKPEN) 100 UNIT/ML Inject 20 Units into the skin at bedtime.   Insulin Pen Needle (PEN NEEDLES) 30G X 8 MM MISC 1 Needle by Does not apply route at bedtime.   metFORMIN (GLUCOPHAGE) 500 MG tablet Take 2  tablets (1,000 mg total) by mouth 2 (two) times daily with a meal.   nitroGLYCERIN (NITROSTAT) 0.4 MG SL tablet Place 1 tablet (0.4 mg total) under the tongue every 5 (five) minutes as needed for chest pain.   omeprazole (PRILOSEC OTC) 20 MG tablet Take 20 mg by mouth daily.   valsartan-hydrochlorothiazide (DIOVAN-HCT) 320-25 MG tablet Take 1 tablet by mouth daily.   [DISCONTINUED] rosuvastatin (CRESTOR) 20 MG tablet Take 1 tablet (20 mg total) by mouth daily. (Patient not taking: Reported on 08/08/2022)     Allergies:   Penicillins   Social History   Socioeconomic History   Marital status: Married    Spouse name: Not on file   Number of children: Not on file   Years of education: Not on file   Highest education level: Some college, no degree  Occupational History   Not on file  Tobacco Use   Smoking status: Former    Types: Cigarettes    Quit date: 2011    Years since quitting: 13.3   Smokeless tobacco: Never  Vaping Use   Vaping Use: Never used  Substance and Sexual Activity   Alcohol use: Yes    Alcohol/week: 0.0 standard drinks of alcohol   Drug use: No   Sexual activity: Not on file  Other Topics Concern   Not on file  Social History Narrative   Not on file   Social Determinants of Health   Financial Resource Strain: Medium Risk (08/04/2022)   Overall Financial Resource Strain (CARDIA)    Difficulty of Paying Living Expenses: Somewhat hard  Food Insecurity: No Food Insecurity (08/04/2022)   Hunger Vital Sign    Worried About Running Out of Food in the Last Year: Never true    Ran Out of Food in the Last Year: Never true  Transportation Needs: No Transportation Needs (08/04/2022)   PRAPARE - Administrator, Civil Service (Medical): No    Lack of Transportation (Non-Medical): No  Physical Activity: Insufficiently Active (08/04/2022)   Exercise Vital Sign    Days of Exercise per Week: 1 day    Minutes of Exercise per Session: 30 min  Stress: Stress  Concern Present (08/04/2022)   [employer] of Occupational Health - Occupational Stress Questionnaire    Feeling of Stress : Very much  Social Connections: Moderately Isolated (08/04/2022)   Social Connection and Isolation Panel [NHANES]    Frequency of Communication with Friends and Family: Once a week    Frequency of Social Gatherings with Friends and Family: More than three times a week    Attends Religious Services: Never    Database administrator or Organizations: No    Attends Engineer, structural: Never    Marital Status: Married     Family History: The patient's family history includes CAD in his maternal grandmother; Cancer in his paternal grandfather; Hypertension in his maternal aunt.  ROS:   Please see the history of present illness.  All other systems reviewed and are negative.  EKGs/Labs/Other Studies Reviewed:    Renal Artery Doppler 03/05/2022: Summary:  Renal:    Right: Normal size right kidney. Normal right Resisitive Index. RRV         flow present. No evidence of right renal artery stenosis.         Normal cortical thickness of right kidney.  Left:  LRV flow present. Normal size of left kidney. Normal left         Resistive Index. Normal cortical thickness of the left         kidney. No evidence of left renal artery stenosis.  Mesenteric:  Normal Superior Mesenteric artery and Celiac artery findings.   CT Cardiac Scoring  02/21/2022: IMPRESSION: Coronary calcium score of 13.7. This was 79th percentile for age-, race-, and sex-matched controls.   Borderline dilated ascending aorta measuring 38mm in diameter at the bifurcation of the main pulmonary artery. Consider 2D echo or gated Chest CTA for further evaluation.  EKG:  EKG is personally reviewed. 07/09/2022: EKG was not ordered.  Recent Labs: 05/01/2022: Magnesium 2.6 06/19/2022: TSH 0.97 08/08/2022: ALT 10; BUN 17; Creatinine, Ser 1.21; Hemoglobin 13.9; Platelets 203.0; Potassium 4.0;  Sodium 140   Recent Lipid Panel    Component Value Date/Time   CHOL 197 06/19/2022 1208   CHOL 179 03/05/2022 0903   TRIG 85.0 06/19/2022 1208   HDL 37.70 (L) 06/19/2022 1208   HDL 34 (L) 03/05/2022 0903   CHOLHDL 5 06/19/2022 1208   VLDL 17.0 06/19/2022 1208   LDLCALC 142 (H) 06/19/2022 1208   LDLCALC 107 (H) 03/05/2022 0903   LDLDIRECT 145.0 11/20/2021 1136    Physical Exam:    VS:  BP 128/82 (BP Location: Right Arm, Patient Position: Sitting, Cuff Size: Large)   Pulse 78   Ht 5\' 11"  (1.803 m)   Wt 208 lb (94.3 kg)   SpO2 96%   BMI 29.01 kg/m  , BMI Body mass index is 29.01 kg/m. GENERAL:  Well appearing HEENT: Pupils equal round and reactive, fundi not visualized, oral mucosa unremarkable NECK:  No jugular venous distention, waveform within normal limits, carotid upstroke brisk and symmetric, no bruits, no thyromegaly LUNGS:  Clear to auscultation bilaterally HEART:  RRR.  PMI not displaced or sustained,S1 and S2 within normal limits, no S3, no S4, no clicks, no rubs, no murmurs ABD:  Flat, positive bowel sounds normal in frequency in pitch, no bruits, no rebound, no guarding, no midline pulsatile mass, no hepatomegaly, no splenomegaly. Umbilical hernia noted. EXT:  2 plus pulses throughout, no edema, no cyanosis, no clubbing SKIN:  No rashes, no nodules NEURO:  Cranial nerves II through XII grossly intact, motor grossly intact throughout PSYCH:  Cognitively intact, oriented to person place and time   ASSESSMENT/PLAN:    Essential hypertension Blood pressure has been well-controlled.  Continue diet and exercise.  Continue amlodipine, carvedilol, valsartan and HCTZ.   Renin and aldosterone levels are indeterminate.  Consider repeating with sodium loading at follow up.   Coronary artery calcification Calcium score 02/2022 was 13.7.  79th percentile.  Continue diet and exercise.  Declines statin.   Screening for Secondary Hypertension:     05/01/2022    8:32 PM   Causes  Renovascular HTN Screened  Sleep Apnea Not Screened  Thyroid Disease Screened  Hyperparathyroidism Screened  Coarctation of the Aorta Screened     - Comments BP symmetrical    Relevant Labs/Studies:    Latest Ref Rng &  Units 08/08/2022   11:38 AM 06/19/2022   12:08 PM 05/02/2022    8:48 PM  Basic Labs  Sodium 135 - 145 mEq/L 140  142  132   Potassium 3.5 - 5.1 mEq/L 4.0  4.1  4.1   Creatinine 0.40 - 1.50 mg/dL 7.82  9.56  2.13        Latest Ref Rng & Units 06/19/2022   12:08 PM 11/20/2021   11:36 AM  Thyroid   TSH 0.35 - 5.50 uIU/mL 0.97  1.01        Latest Ref Rng & Units 04/27/2019    2:59 PM  Renin/Aldosterone   Aldosterone 0.0 - 30.0 ng/dL 08.6   Renin 5.784 - 6.962 ng/mL/hr 0.236  C  Aldos/Renin Ratio 0.0 - 30.0 97.0  C    C Corrected result             03/05/2022   10:37 AM  Renovascular   Renal Artery [place] Completed Yes      Disposition:    FU with Julyan Gales C. Duke Salvia, MD, [facility] in 6 months.  Medication Adjustments/Labs and Tests Ordered: Current medicines are reviewed at length with the patient today.  Concerns regarding medicines are outlined above.   No orders of the defined types were placed in this encounter.  No orders of the defined types were placed in this encounter.  I,Mathew Stumpf,acting as a Neurosurgeon for Chilton Si, MD.,have documented all relevant documentation on the behalf of Chilton Si, MD,as directed by  Chilton Si, MD while in the presence of Chilton Si, MD.  I, Gurney Balthazor C. Duke Salvia, MD have reviewed all documentation for this visit.  The documentation of the exam, diagnosis, procedures, and orders on 08/25/2022 are all accurate and complete.   Signed, Chilton Si, MD  08/25/2022 4:39 PM    [facility]

## 2022-07-31 LAB — HM DIABETES EYE EXAM

## 2022-08-07 ENCOUNTER — Ambulatory Visit: Payer: Self-pay | Admitting: Family Medicine

## 2022-08-08 ENCOUNTER — Ambulatory Visit (INDEPENDENT_AMBULATORY_CARE_PROVIDER_SITE_OTHER): Payer: Self-pay | Admitting: Family Medicine

## 2022-08-08 ENCOUNTER — Encounter: Payer: Self-pay | Admitting: Family Medicine

## 2022-08-08 VITALS — BP 118/84 | HR 70 | Temp 97.6°F | Ht 71.0 in | Wt 209.0 lb

## 2022-08-08 DIAGNOSIS — Z8619 Personal history of other infectious and parasitic diseases: Secondary | ICD-10-CM

## 2022-08-08 DIAGNOSIS — I10 Essential (primary) hypertension: Secondary | ICD-10-CM

## 2022-08-08 DIAGNOSIS — Z23 Encounter for immunization: Secondary | ICD-10-CM | POA: Insufficient documentation

## 2022-08-08 DIAGNOSIS — K429 Umbilical hernia without obstruction or gangrene: Secondary | ICD-10-CM | POA: Insufficient documentation

## 2022-08-08 DIAGNOSIS — Z794 Long term (current) use of insulin: Secondary | ICD-10-CM

## 2022-08-08 DIAGNOSIS — K219 Gastro-esophageal reflux disease without esophagitis: Secondary | ICD-10-CM | POA: Insufficient documentation

## 2022-08-08 DIAGNOSIS — E1165 Type 2 diabetes mellitus with hyperglycemia: Secondary | ICD-10-CM | POA: Insufficient documentation

## 2022-08-08 DIAGNOSIS — E782 Mixed hyperlipidemia: Secondary | ICD-10-CM

## 2022-08-08 LAB — COMPREHENSIVE METABOLIC PANEL
ALT: 10 U/L (ref 0–53)
AST: 17 U/L (ref 0–37)
Albumin: 4.3 g/dL (ref 3.5–5.2)
Alkaline Phosphatase: 32 U/L — ABNORMAL LOW (ref 39–117)
BUN: 17 mg/dL (ref 6–23)
CO2: 29 mEq/L (ref 19–32)
Calcium: 9.5 mg/dL (ref 8.4–10.5)
Chloride: 103 mEq/L (ref 96–112)
Creatinine, Ser: 1.21 mg/dL (ref 0.40–1.50)
GFR: 68.64 mL/min (ref >60.00)
Glucose, Bld: 74 mg/dL (ref 70–99)
Potassium: 4 mEq/L (ref 3.5–5.1)
Sodium: 140 mEq/L (ref 135–145)
Total Bilirubin: 0.7 mg/dL (ref 0.2–1.2)
Total Protein: 7.2 g/dL (ref 6.0–8.3)

## 2022-08-08 LAB — CBC WITH DIFFERENTIAL/PLATELET
Basophils Absolute: 0 10*3/uL (ref 0.0–0.1)
Basophils Relative: 1 % (ref 0.0–3.0)
Eosinophils Absolute: 0.5 10*3/uL (ref 0.0–0.7)
Eosinophils Relative: 10.8 % — ABNORMAL HIGH (ref 0.0–5.0)
HCT: 42.2 % (ref 39.0–52.0)
Hemoglobin: 13.9 g/dL (ref 13.0–17.0)
Lymphocytes Relative: 34.9 % (ref 12.0–46.0)
Lymphs Abs: 1.5 10*3/uL (ref 0.7–4.0)
MCHC: 32.9 g/dL (ref 30.0–36.0)
MCV: 93.2 fl (ref 78.0–100.0)
Monocytes Absolute: 0.4 10*3/uL (ref 0.1–1.0)
Monocytes Relative: 8.1 % (ref 3.0–12.0)
Neutro Abs: 2 10*3/uL (ref 1.4–7.7)
Neutrophils Relative %: 45.2 % (ref 43.0–77.0)
Platelets: 203 10*3/uL (ref 150.0–400.0)
RBC: 4.53 Mil/uL (ref 4.22–5.81)
RDW: 13.5 % (ref 11.5–15.5)
WBC: 4.4 10*3/uL (ref 4.0–10.5)

## 2022-08-08 LAB — HEMOGLOBIN A1C: Hgb A1c MFr Bld: 6.1 % (ref 4.6–6.5)

## 2022-08-08 NOTE — Assessment & Plan Note (Signed)
Not bothersome. Monitor.

## 2022-08-08 NOTE — Patient Instructions (Addendum)
Please call [facility] to schedule your appointment to discuss colonoscopy.  Phone: [phone]  We will be in touch with your lab results and recommendations.   Follow up in 3 -4 months.

## 2022-08-08 NOTE — Progress Notes (Signed)
Please let him know that his A1c is now 6.1%. I think he is being too strict with his blood sugars. As we discussed, cut back on Basaglar dose to maintain blood sugar between 80-120 and no lower. I am concerned he may be dropping his sugar too low.

## 2022-08-08 NOTE — Assessment & Plan Note (Signed)
He would like to get the shingles vaccine however he is paying out-of-pocket and vaccine is approximately $300.  He will hold off for now.

## 2022-08-08 NOTE — Progress Notes (Signed)
Subjective:     Patient ID: Cameron Wells, male    DOB: 1970/08/27, 52 y.o.   MRN: [id number]  Chief Complaint  Patient presents with   Diabetes    Didn't start on statin, doesn't like them.     Diabetes Pertinent negatives for hypoglycemia include no dizziness. Pertinent negatives for diabetes include no chest pain.   Patient is in today for follow up on chronic health conditions including uncontrolled DM.   FBS 77, 88, 108, 98, 88, 71  Currently on Basaglar 14 units daily. He is titrating it down based on FBS. No hypoglycemia.  Also taking metformin.   States he has myalgias on statin.  He stopped it after a couple of weeks of taking it.   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   C/o umbilical hernia. No pain or enlargement.   States he had shingles. Wants to get the Shingrix vaccine but would need to pay out of pocket.  Also overdue for Tdap     Health Maintenance Due  Topic Date Due   OPHTHALMOLOGY EXAM  Never done   Zoster Vaccines- Shingrix (1 of 2) Never done    Past Medical History:  Diagnosis Date   Allergy    Anxiety    Asthma    DM (diabetes mellitus)    Essential hypertension 06/19/2014   Dx 2010.     Hyperlipidemia    Hypertension    Panic attack     History reviewed. No pertinent surgical history.  Family History  Problem Relation Age of Onset   CAD Maternal Grandmother    Cancer Paternal Grandfather    Hypertension Maternal Aunt     Social History   Socioeconomic History   Marital status: Married    Spouse name: Not on file   Number of children: Not on file   Years of education: Not on file   Highest education level: Some college, no degree  Occupational History   Not on file  Tobacco Use   Smoking status: Former    Types: Cigarettes    Quit date: 2011    Years since quitting: 13.3   Smokeless tobacco: Never  Vaping Use   Vaping Use: Never used  Substance and Sexual Activity   Alcohol use: Yes    Alcohol/week: 0.0 standard drinks of  alcohol   Drug use: No   Sexual activity: Not on file  Other Topics Concern   Not on file  Social History Narrative   Not on file   Social Determinants of Health   Financial Resource Strain: Medium Risk (08/04/2022)   Overall Financial Resource Strain (CARDIA)    Difficulty of Paying Living Expenses: Somewhat hard  Food Insecurity: No Food Insecurity (08/04/2022)   Hunger Vital Sign    Worried About Running Out of Food in the Last Year: Never true    Ran Out of Food in the Last Year: Never true  Transportation Needs: No Transportation Needs (08/04/2022)   PRAPARE - Administrator, Civil Service (Medical): No    Lack of Transportation (Non-Medical): No  Physical Activity: Insufficiently Active (08/04/2022)   Exercise Vital Sign    Days of Exercise per Week: 1 day    Minutes of Exercise per Session: 30 min  Stress: Stress Concern Present (08/04/2022)   [employer] of Occupational Health - Occupational Stress Questionnaire    Feeling of Stress : Very much  Social Connections: Moderately Isolated (08/04/2022)   Social Connection and Isolation Panel [NHANES]  Frequency of Communication with Friends and Family: Once a week    Frequency of Social Gatherings with Friends and Family: More than three times a week    Attends Religious Services: Never    Database administrator or Organizations: No    Attends Engineer, structural: Never    Marital Status: Married  Catering manager Violence: Not on file    Outpatient Medications Prior to Visit  Medication Sig Dispense Refill   ACCU-CHEK GUIDE test strip USE UP TO FOUR TIMES DAILY AS DIRECTED     Accu-Chek Softclix Lancets lancets SMARTSIG:Topical 1-4 Times Daily     amLODipine (NORVASC) 10 MG tablet Take 1 tablet (10 mg total) by mouth daily. 90 tablet 3   aspirin EC 81 MG tablet Take 1 tablet (81 mg total) by mouth daily. Swallow whole. 90 tablet 3   blood glucose meter kit and supplies KIT Dispense based on  patient and insurance preference. Use up to four times daily as directed. 1 each 0   carvedilol (COREG) 12.5 MG tablet Take 1 tablet (12.5 mg total) by mouth 2 (two) times daily. 180 tablet 3   cetirizine (ZYRTEC) 10 MG tablet TAKE 1 TABLET BY MOUTH ONCE DAILY ** COURTESY REFILL. NEEDS OFFICE VISIT AND LABS BEFORE FURTHER REFILLS** 30 tablet 11   EPINEPHrine 0.3 mg/0.3 mL IJ SOAJ injection Inject 0.3 mg into the muscle as needed for anaphylaxis. 1 each 0   fluticasone (FLONASE) 50 MCG/ACT nasal spray Use 2 spray(s) in each nostril once daily 16 g 0   Insulin Glargine (BASAGLAR KWIKPEN) 100 UNIT/ML Inject 20 Units into the skin at bedtime. 15 mL 0   Insulin Pen Needle (PEN NEEDLES) 30G X 8 MM MISC 1 Needle by Does not apply route at bedtime. 100 each 0   metFORMIN (GLUCOPHAGE) 500 MG tablet Take 2 tablets (1,000 mg total) by mouth 2 (two) times daily with a meal. 360 tablet 1   nitroGLYCERIN (NITROSTAT) 0.4 MG SL tablet Place 1 tablet (0.4 mg total) under the tongue every 5 (five) minutes as needed for chest pain. 25 tablet 3   omeprazole (PRILOSEC OTC) 20 MG tablet Take 20 mg by mouth daily.     valsartan-hydrochlorothiazide (DIOVAN-HCT) 320-25 MG tablet Take 1 tablet by mouth daily. 90 tablet 3   rosuvastatin (CRESTOR) 20 MG tablet Take 1 tablet (20 mg total) by mouth daily. (Patient not taking: Reported on 08/08/2022) 90 tablet 3   No facility-administered medications prior to visit.    Allergies  Allergen Reactions   Penicillins Anaphylaxis    Review of Systems  Constitutional:  Negative for chills and fever.  Respiratory:  Negative for shortness of breath.   Cardiovascular:  Negative for chest pain, palpitations and leg swelling.  Gastrointestinal:  Negative for abdominal pain, constipation, diarrhea, nausea and vomiting.  Genitourinary:  Negative for dysuria, frequency and urgency.  Musculoskeletal:  Positive for myalgias.       Resolved after stopping statin  Neurological:  Negative  for dizziness and focal weakness.       Objective:    Physical Exam Constitutional:      General: He is not in acute distress.    Appearance: He is not ill-appearing.  Eyes:     Extraocular Movements: Extraocular movements intact.     Conjunctiva/sclera: Conjunctivae normal.  Cardiovascular:     Rate and Rhythm: Normal rate.  Pulmonary:     Effort: Pulmonary effort is normal.  Abdominal:     General:  There is no distension.     Palpations: Abdomen is soft.     Tenderness: There is no abdominal tenderness.     Hernia: A hernia is present.     Comments: Small umbilical hernia, no skin changes.   Musculoskeletal:     Cervical back: Normal range of motion and neck supple.  Skin:    General: Skin is warm and dry.  Neurological:     General: No focal deficit present.     Mental Status: He is alert and oriented to person, place, and time.  Psychiatric:        Mood and Affect: Mood normal.        Behavior: Behavior normal.        Thought Content: Thought content normal.     BP 118/84 (BP Location: Left Arm, Patient Position: Sitting, Cuff Size: Large)   Pulse 70   Temp 97.6 F (36.4 C) (Temporal)   Ht  (1.803 m)   Wt 209 lb (94.8 kg)   SpO2 99%   BMI 29.15 kg/m  Wt Readings from Last 3 Encounters:  08/08/22 209 lb (94.8 kg)  07/09/22 208 lb (94.3 kg)  06/19/22 214 lb (97.1 kg)       Assessment & Plan:   Problem List Items Addressed This Visit       Cardiovascular and Mediastinum   Essential hypertension    Blood pressure in goal range.  Followed by hypertension clinic.      Relevant Orders   CBC with Differential/Platelet   Comprehensive metabolic panel     Endocrine   Uncontrolled diabetes mellitus with hyperglycemia, with long-term current use of insulin - Primary    Blood sugars have improved significantly.  He is monitoring them at home.  Currently taking Basaglar 14 units daily.  He has been titrating the Basaglar based on his fasting blood  sugar.  We discussed maintaining blood sugars between 80 and 120.  No hypoglycemia.  Continue metformin.  Check labs including A1c and follow-up in 3 to 4 months. Diabetic eye exam UTD, will request.       Relevant Orders   CBC with Differential/Platelet   Comprehensive metabolic panel   Hemoglobin A1c     Other   History of shingles    He would like to get the shingles vaccine however he is paying out-of-pocket and vaccine is approximately $300.  He will hold off for now.      Mixed hyperlipidemia    Myalgias with Crestor daily.  He will try to take the medication 3 days/week and we will try adding co-Q10.  Continue with a low-fat, low-cholesterol diet and getting adequate exercise.      Need for diphtheria-tetanus-pertussis (Tdap) vaccine   Relevant Orders   Tdap vaccine greater than or equal to 7yo IM (Completed)   Periumbilical hernia    Not bothersome. Monitor.        I have discontinued Jerral Volker's rosuvastatin. I am also having him maintain his omeprazole, EPINEPHrine, cetirizine, valsartan-hydrochlorothiazide, aspirin EC, nitroGLYCERIN, amLODipine, carvedilol, metFORMIN, blood glucose meter kit and supplies, Pen Needles, fluticasone, Accu-Chek Guide, Accu-Chek Softclix Lancets, and Basaglar KwikPen.  No orders of the defined types were placed in this encounter.

## 2022-08-08 NOTE — Assessment & Plan Note (Signed)
Blood sugars have improved significantly.  He is monitoring them at home.  Currently taking Basaglar 14 units daily.  He has been titrating the Basaglar based on his fasting blood sugar.  We discussed maintaining blood sugars between 80 and 120.  No hypoglycemia.  Continue metformin.  Check labs including A1c and follow-up in 3 to 4 months. Diabetic eye exam UTD, will request.

## 2022-08-08 NOTE — Assessment & Plan Note (Signed)
Blood pressure in goal range.  Followed by hypertension clinic.

## 2022-08-08 NOTE — Assessment & Plan Note (Signed)
Myalgias with Crestor daily.  He will try to take the medication 3 days/week and we will try adding co-Q10.  Continue with a low-fat, low-cholesterol diet and getting adequate exercise.

## 2022-08-13 ENCOUNTER — Encounter: Payer: Self-pay | Admitting: Family Medicine

## 2022-08-25 ENCOUNTER — Encounter (HOSPITAL_BASED_OUTPATIENT_CLINIC_OR_DEPARTMENT_OTHER): Payer: Self-pay | Admitting: Cardiovascular Disease

## 2022-08-25 DIAGNOSIS — I2510 Atherosclerotic heart disease of native coronary artery without angina pectoris: Secondary | ICD-10-CM

## 2022-08-25 HISTORY — DX: Atherosclerotic heart disease of native coronary artery without angina pectoris: I25.10

## 2022-08-25 NOTE — Assessment & Plan Note (Addendum)
Blood pressure has been well-controlled.  Continue diet and exercise.  Continue amlodipine, carvedilol, valsartan and HCTZ.   Renin and aldosterone levels are indeterminate.  Consider repeating with sodium loading at follow up.

## 2022-08-25 NOTE — Assessment & Plan Note (Addendum)
Calcium score 02/2022 was 13.7.  79th percentile.  Continue diet and exercise.  Declines statin.

## 2022-10-08 ENCOUNTER — Telehealth: Payer: Self-pay | Admitting: Family Medicine

## 2022-10-08 DIAGNOSIS — Z889 Allergy status to unspecified drugs, medicaments and biological substances status: Secondary | ICD-10-CM

## 2022-10-08 NOTE — Telephone Encounter (Signed)
Prescription Request  10/08/2022  LOV: 08/08/2022  What is the name of the medication or equipment? Certizine and flonase  - patient would like certirizine to be 90 days  Have you contacted your pharmacy to request a refill? Yes   Which pharmacy would you like this sent to?  [facility] [address] [place], [place] - [address] [place] [address] Phone: [phone] Fax: [phone]    Patient notified that their request is being sent to the clinical staff for review and that they should receive a response within 2 business days.   Please advise at Mobile [phone] (mobile)

## 2022-10-09 ENCOUNTER — Other Ambulatory Visit: Payer: Self-pay | Admitting: Family Medicine

## 2022-10-09 DIAGNOSIS — E119 Type 2 diabetes mellitus without complications: Secondary | ICD-10-CM

## 2022-10-09 MED ORDER — CETIRIZINE HCL 10 MG PO TABS: ORAL_TABLET | ORAL | 1 refills | Status: DC

## 2022-10-09 MED ORDER — FLUTICASONE PROPIONATE 50 MCG/ACT NA SUSP: NASAL | 1 refills | Status: DC

## 2022-10-09 NOTE — Telephone Encounter (Signed)
Rx sent 

## 2022-11-28 ENCOUNTER — Ambulatory Visit (INDEPENDENT_AMBULATORY_CARE_PROVIDER_SITE_OTHER): Payer: Self-pay | Admitting: Family Medicine

## 2022-11-28 ENCOUNTER — Encounter: Payer: Self-pay | Admitting: Family Medicine

## 2022-11-28 VITALS — BP 112/84 | HR 70 | Temp 97.6°F | Ht 71.0 in | Wt 202.0 lb

## 2022-11-28 DIAGNOSIS — Z794 Long term (current) use of insulin: Secondary | ICD-10-CM

## 2022-11-28 DIAGNOSIS — E119 Type 2 diabetes mellitus without complications: Secondary | ICD-10-CM

## 2022-11-28 DIAGNOSIS — I10 Essential (primary) hypertension: Secondary | ICD-10-CM

## 2022-11-28 DIAGNOSIS — Z125 Encounter for screening for malignant neoplasm of prostate: Secondary | ICD-10-CM

## 2022-11-28 DIAGNOSIS — R7989 Other specified abnormal findings of blood chemistry: Secondary | ICD-10-CM

## 2022-11-28 DIAGNOSIS — E785 Hyperlipidemia, unspecified: Secondary | ICD-10-CM

## 2022-11-28 DIAGNOSIS — Z1211 Encounter for screening for malignant neoplasm of colon: Secondary | ICD-10-CM

## 2022-11-28 LAB — LIPID PANEL
Cholesterol: 223 mg/dL — ABNORMAL HIGH (ref 0–200)
HDL: 48 mg/dL (ref >39.00)
LDL Cholesterol: 153 mg/dL — ABNORMAL HIGH (ref 0–99)
NonHDL: 174.55
Total CHOL/HDL Ratio: 5
Triglycerides: 109 mg/dL (ref 0.0–149.0)
VLDL: 21.8 mg/dL (ref 0.0–40.0)

## 2022-11-28 LAB — CBC WITH DIFFERENTIAL/PLATELET
Basophils Absolute: 0.1 10*3/uL (ref 0.0–0.1)
Basophils Relative: 0.9 % (ref 0.0–3.0)
Eosinophils Absolute: 0.7 10*3/uL (ref 0.0–0.7)
Eosinophils Relative: 12.2 % — ABNORMAL HIGH (ref 0.0–5.0)
HCT: 40.6 % (ref 39.0–52.0)
Hemoglobin: 13.3 g/dL (ref 13.0–17.0)
Lymphocytes Relative: 22.4 % (ref 12.0–46.0)
Lymphs Abs: 1.3 10*3/uL (ref 0.7–4.0)
MCHC: 32.7 g/dL (ref 30.0–36.0)
MCV: 94 fl (ref 78.0–100.0)
Monocytes Absolute: 0.5 10*3/uL (ref 0.1–1.0)
Monocytes Relative: 7.6 % (ref 3.0–12.0)
Neutro Abs: 3.4 10*3/uL (ref 1.4–7.7)
Neutrophils Relative %: 56.9 % (ref 43.0–77.0)
Platelets: 207 10*3/uL (ref 150.0–400.0)
RBC: 4.32 Mil/uL (ref 4.22–5.81)
RDW: 14.3 % (ref 11.5–15.5)
WBC: 6 10*3/uL (ref 4.0–10.5)

## 2022-11-28 LAB — COMPREHENSIVE METABOLIC PANEL
ALT: 11 U/L (ref 0–53)
AST: 17 U/L (ref 0–37)
Albumin: 4.5 g/dL (ref 3.5–5.2)
Alkaline Phosphatase: 38 U/L — ABNORMAL LOW (ref 39–117)
BUN: 16 mg/dL (ref 6–23)
CO2: 30 mEq/L (ref 19–32)
Calcium: 9.7 mg/dL (ref 8.4–10.5)
Chloride: 103 mEq/L (ref 96–112)
Creatinine, Ser: 1.49 mg/dL (ref 0.40–1.50)
GFR: 53.36 mL/min — ABNORMAL LOW (ref >60.00)
Glucose, Bld: 86 mg/dL (ref 70–99)
Potassium: 3.6 mEq/L (ref 3.5–5.1)
Sodium: 140 mEq/L (ref 135–145)
Total Bilirubin: 0.7 mg/dL (ref 0.2–1.2)
Total Protein: 7 g/dL (ref 6.0–8.3)

## 2022-11-28 LAB — VITAMIN B12: Vitamin B-12: 233 pg/mL (ref 211–911)

## 2022-11-28 LAB — MICROALBUMIN / CREATININE URINE RATIO
Creatinine,U: 190.9 mg/dL
Microalb Creat Ratio: 0.4 mg/g (ref 0.0–30.0)
Microalb, Ur: <0.7 mg/dL (ref 0.0–1.9)

## 2022-11-28 LAB — TSH: TSH: 1 u[IU]/mL (ref 0.35–5.50)

## 2022-11-28 LAB — PSA: PSA: 0.23 ng/mL (ref 0.10–4.00)

## 2022-11-28 LAB — HEMOGLOBIN A1C: Hgb A1c MFr Bld: 5.4 % (ref 4.6–6.5)

## 2022-11-28 MED ORDER — ROSUVASTATIN CALCIUM 5 MG PO TABS: 5.0000 mg | ORAL_TABLET | Freq: Every day | ORAL | 1 refills | Status: DC

## 2022-11-28 NOTE — Patient Instructions (Signed)
Please go downstairs for urine test and labs before you leave.  Start rosuvastatin 5 mg 3 days/week.  Continue monitoring your blood sugar and blood pressure.  You will hear from [facility] gastroenterology to schedule a visit to discuss colonoscopy.  We will be in touch with your lab results.

## 2022-11-28 NOTE — Progress Notes (Signed)
Subjective:     Patient ID: Cameron Wells, male    DOB: 10-Jun-1970, 52 y.o.   MRN: [id number]  Chief Complaint  Patient presents with   Follow-up    4 month f/u, BS ranging from 74-105    HPI  Discussed the use of AI scribe software for clinical note transcription with the patient, who gave verbal consent to proceed.  History of Present Illness          Here for follow up on chronic health conditions.   DM- BS in 70 range on a couple of occasional but mostly in 90-105 range. Denies any symptoms.  Basaglar 12 units daily.   He has not been taking rosuvastatin.    Health Maintenance Due  Topic Date Due   HIV Screening  Never done   Hepatitis C Screening  Never done   Colonoscopy  Never done   Zoster Vaccines- Shingrix (1 of 2) Never done    Past Medical History:  Diagnosis Date   Allergy    Anxiety    Asthma    Coronary artery calcification 08/25/2022   DM (diabetes mellitus) (HCC)    Essential hypertension 06/19/2014   Dx 2010.     Hyperlipidemia    Hypertension    Panic attack     History reviewed. No pertinent surgical history.  Family History  Problem Relation Age of Onset   CAD Maternal Grandmother    Cancer Paternal Grandfather    Hypertension Maternal Aunt     Social History   Socioeconomic History   Marital status: Married    Spouse name: Not on file   Number of children: Not on file   Years of education: Not on file   Highest education level: Some college, no degree  Occupational History   Not on file  Tobacco Use   Smoking status: Former    Current packs/day: 0.00    Types: Cigarettes    Quit date: 2011    Years since quitting: 13.6   Smokeless tobacco: Never  Vaping Use   Vaping status: Never Used  Substance and Sexual Activity   Alcohol use: Yes    Alcohol/week: 0.0 standard drinks of alcohol   Drug use: No   Sexual activity: Not on file  Other Topics Concern   Not on file  Social History Narrative   Not on file   Social  Determinants of Health   Financial Resource Strain: Medium Risk (08/04/2022)   Overall Financial Resource Strain (CARDIA)    Difficulty of Paying Living Expenses: Somewhat hard  Food Insecurity: No Food Insecurity (08/04/2022)   Hunger Vital Sign    Worried About Running Out of Food in the Last Year: Never true    Ran Out of Food in the Last Year: Never true  Transportation Needs: No Transportation Needs (08/04/2022)   PRAPARE - Administrator, Civil Service (Medical): No    Lack of Transportation (Non-Medical): No  Physical Activity: Insufficiently Active (08/04/2022)   Exercise Vital Sign    Days of Exercise per Week: 1 day    Minutes of Exercise per Session: 30 min  Stress: Stress Concern Present (08/04/2022)   [employer] of Occupational Health - Occupational Stress Questionnaire    Feeling of Stress : Very much  Social Connections: Moderately Isolated (08/04/2022)   Social Connection and Isolation Panel [NHANES]    Frequency of Communication with Friends and Family: Once a week    Frequency of Social Gatherings with Friends  and Family: More than three times a week    Attends Religious Services: Never    Active Member of Clubs or Organizations: No    Attends Banker Meetings: Never    Marital Status: Married  Catering manager Violence: Not on file    Outpatient Medications Prior to Visit  Medication Sig Dispense Refill   ACCU-CHEK GUIDE test strip USE UP TO FOUR TIMES DAILY AS DIRECTED     Accu-Chek Softclix Lancets lancets SMARTSIG:Topical 1-4 Times Daily     amLODipine (NORVASC) 10 MG tablet Take 1 tablet (10 mg total) by mouth daily. 90 tablet 3   aspirin EC 81 MG tablet Take 1 tablet (81 mg total) by mouth daily. Swallow whole. 90 tablet 3   blood glucose meter kit and supplies KIT Dispense based on patient and insurance preference. Use up to four times daily as directed. 1 each 0   carvedilol (COREG) 12.5 MG tablet Take 1 tablet (12.5 mg total)  by mouth 2 (two) times daily. 180 tablet 3   cetirizine (ZYRTEC) 10 MG tablet TAKE 1 TABLET BY MOUTH ONCE DAILY 90 tablet 1   EPINEPHrine 0.3 mg/0.3 mL IJ SOAJ injection Inject 0.3 mg into the muscle as needed for anaphylaxis. 1 each 0   fluticasone (FLONASE) 50 MCG/ACT nasal spray Use 2 spray(s) in each nostril once daily 16 g 1   Insulin Glargine (BASAGLAR KWIKPEN) 100 UNIT/ML INJECT 20 UNITS SUBCUTANEOUSLY AT BEDTIME 15 mL 0   Insulin Pen Needle (PEN NEEDLES) 30G X 8 MM MISC 1 Needle by Does not apply route at bedtime. 100 each 0   metFORMIN (GLUCOPHAGE) 500 MG tablet Take 2 tablets (1,000 mg total) by mouth 2 (two) times daily with a meal. 360 tablet 1   omeprazole (PRILOSEC OTC) 20 MG tablet Take 20 mg by mouth daily.     valsartan-hydrochlorothiazide (DIOVAN-HCT) 320-25 MG tablet Take 1 tablet by mouth daily. 90 tablet 3   nitroGLYCERIN (NITROSTAT) 0.4 MG SL tablet Place 1 tablet (0.4 mg total) under the tongue every 5 (five) minutes as needed for chest pain. 25 tablet 3   No facility-administered medications prior to visit.    Allergies  Allergen Reactions   Penicillins Anaphylaxis    Review of Systems  Constitutional:  Positive for weight loss. Negative for chills and fever.  Respiratory:  Negative for shortness of breath.   Cardiovascular:  Negative for chest pain, palpitations and leg swelling.  Gastrointestinal:  Negative for abdominal pain, constipation, diarrhea, nausea and vomiting.  Genitourinary:  Negative for dysuria, frequency and urgency.  Musculoskeletal:  Negative for joint pain and myalgias.  Neurological:  Negative for sensory change, focal weakness and headaches.       Objective:    Physical Exam Constitutional:      General: He is not in acute distress.    Appearance: He is not ill-appearing.  Eyes:     Extraocular Movements: Extraocular movements intact.     Conjunctiva/sclera: Conjunctivae normal.  Cardiovascular:     Rate and Rhythm: Normal rate and  regular rhythm.  Pulmonary:     Effort: Pulmonary effort is normal.     Breath sounds: Normal breath sounds.  Musculoskeletal:     Cervical back: Normal range of motion and neck supple.     Right lower leg: No edema.     Left lower leg: No edema.  Skin:    General: Skin is warm and dry.  Neurological:     General: No focal  deficit present.     Mental Status: He is alert and oriented to person, place, and time.  Psychiatric:        Mood and Affect: Mood normal.        Behavior: Behavior normal.        Thought Content: Thought content normal.      BP 112/84 (BP Location: Left Arm, Patient Position: Sitting, Cuff Size: Large)   Pulse 70   Temp 97.6 F (36.4 C) (Temporal)   Ht 5\' 11"  (1.803 m)   Wt 202 lb (91.6 kg)   SpO2 99%   BMI 28.17 kg/m  Wt Readings from Last 3 Encounters:  11/28/22 202 lb (91.6 kg)  08/08/22 209 lb (94.8 kg)  07/09/22 208 lb (94.3 kg)       Assessment & Plan:   Problem List Items Addressed This Visit       Cardiovascular and Mediastinum   Essential hypertension - Primary    Controlled. Continue medications. Follow up with cardiology as recommended.       Relevant Medications   rosuvastatin (CRESTOR) 5 MG tablet   Other Relevant Orders   Comprehensive metabolic panel (Completed)   CBC with Differential/Platelet (Completed)   Comprehensive metabolic panel     Endocrine   Controlled type 2 diabetes mellitus without complication, with long-term current use of insulin (HCC)    Discussed hypoglycemia prevention.  He will begin to titrate Basaglar down since BS are in the 70s a times.  Continue metformin 1000 mg twice daily.  Continue monitoring blood sugars closely.  Continue with healthy diet and exercise. Start back on statin. Check labs including lipids.  Follow up for fasting labs in 6 weeks and with me in 4 months.       Relevant Medications   rosuvastatin (CRESTOR) 5 MG tablet   Other Relevant Orders   Comprehensive metabolic panel  (Completed)   CBC with Differential/Platelet (Completed)   TSH (Completed)   Vitamin B12 (Completed)   Hemoglobin A1c (Completed)   Microalbumin / creatinine urine ratio (Completed)     Other   Dyslipidemia    He did not start on statin. He is willing to do so. Refilled medication. Check fasting lipids.       Relevant Medications   rosuvastatin (CRESTOR) 5 MG tablet   Other Relevant Orders   Lipid panel (Completed)   Lipid panel   Comprehensive metabolic panel   Elevated serum creatinine    Hydrate. Recheck at 6 wk lab visit.       Screen for colon cancer    This will be his first colon cancer screening.       Relevant Orders   Ambulatory referral to Gastroenterology   Other Visit Diagnoses     Screening for prostate cancer       Relevant Orders   PSA (Completed)       I am having Cameron Wells start on rosuvastatin. I am also having him maintain his omeprazole, EPINEPHrine, valsartan-hydrochlorothiazide, aspirin EC, nitroGLYCERIN, amLODipine, carvedilol, metFORMIN, blood glucose meter kit and supplies, Pen Needles, Accu-Chek Guide, Accu-Chek Softclix Lancets, fluticasone, cetirizine, and Basaglar KwikPen.  Meds ordered this encounter  Medications   rosuvastatin (CRESTOR) 5 MG tablet    Sig: Take 1 tablet (5 mg total) by mouth daily.    Dispense:  90 tablet    Refill:  1    Order Specific Question:   Supervising Provider    Answer:   Hillard Danker A [[id number]]

## 2022-12-01 DIAGNOSIS — R7989 Other specified abnormal findings of blood chemistry: Secondary | ICD-10-CM | POA: Insufficient documentation

## 2022-12-01 NOTE — Assessment & Plan Note (Signed)
He did not start on statin. He is willing to do so. Refilled medication. Check fasting lipids.

## 2022-12-01 NOTE — Assessment & Plan Note (Signed)
Hydrate. Recheck at 6 wk lab visit.

## 2022-12-01 NOTE — Progress Notes (Signed)
His Hgb A1c is 5.4% and this is in normal range (too low now since he is on several diabetes medications).  I recommend he cut his insulin back to 5 units daily. Most likely, he will be able to stop insulin. He may continue his other medications. If he is seeing blood sugars in the 70 range (or lower) then stop insulin completely. His cholesterol is not controlled. I recommend he take the rosuvastatin daily to get this under control and reduce his risk for heart attack and stroke. His kidney function was abnormal, this may be due to not being well hydrated. Drink water daily 6-8 glasses per day. I also recommend taking a multivitamin due to vitamin B12 being borderline low. Please schedule fasting lab visit in 6 weeks after taking statin if he does not have a visit scheduled. Follow up with me in 4 months.

## 2022-12-01 NOTE — Assessment & Plan Note (Signed)
Discussed hypoglycemia prevention.  He will begin to titrate Basaglar down since BS are in the 70s a times.  Continue metformin 1000 mg twice daily.  Continue monitoring blood sugars closely.  Continue with healthy diet and exercise. Start back on statin. Check labs including lipids.  Follow up for fasting labs in 6 weeks and with me in 4 months.

## 2022-12-01 NOTE — Assessment & Plan Note (Signed)
Controlled. Continue medications. Follow up with cardiology as recommended.

## 2022-12-01 NOTE — Assessment & Plan Note (Signed)
This will be his first colon cancer screening.

## 2022-12-22 ENCOUNTER — Other Ambulatory Visit: Payer: Self-pay | Admitting: Family Medicine

## 2022-12-22 DIAGNOSIS — E119 Type 2 diabetes mellitus without complications: Secondary | ICD-10-CM

## 2022-12-23 NOTE — Telephone Encounter (Signed)
Are we changing instructions to 12 units daily? Per last ov notes you wanted him to titrate Basaglar down from 20 units due to low BS

## 2022-12-23 NOTE — Telephone Encounter (Signed)
LM for pt to let us know in regards to dosage change, medications and blood sugars. Will send in updated rx to reflect change in units.

## 2023-01-13 ENCOUNTER — Encounter (HOSPITAL_BASED_OUTPATIENT_CLINIC_OR_DEPARTMENT_OTHER): Payer: Self-pay | Admitting: Cardiovascular Disease

## 2023-01-13 ENCOUNTER — Ambulatory Visit (INDEPENDENT_AMBULATORY_CARE_PROVIDER_SITE_OTHER): Payer: Self-pay | Admitting: Cardiovascular Disease

## 2023-01-13 VITALS — BP 117/80 | HR 68 | Ht 71.0 in | Wt 201.6 lb

## 2023-01-13 DIAGNOSIS — I10 Essential (primary) hypertension: Secondary | ICD-10-CM

## 2023-01-13 DIAGNOSIS — I2584 Coronary atherosclerosis due to calcified coronary lesion: Secondary | ICD-10-CM

## 2023-01-13 DIAGNOSIS — E785 Hyperlipidemia, unspecified: Secondary | ICD-10-CM

## 2023-01-13 DIAGNOSIS — E119 Type 2 diabetes mellitus without complications: Secondary | ICD-10-CM

## 2023-01-13 DIAGNOSIS — R002 Palpitations: Secondary | ICD-10-CM

## 2023-01-13 DIAGNOSIS — I2510 Atherosclerotic heart disease of native coronary artery without angina pectoris: Secondary | ICD-10-CM

## 2023-01-13 DIAGNOSIS — Z794 Long term (current) use of insulin: Secondary | ICD-10-CM

## 2023-01-13 HISTORY — DX: Palpitations: R00.2

## 2023-01-13 NOTE — Progress Notes (Deleted)
[facility] Follow-up:    Date:  01/13/2023   ID:  Cameron Wells, DOB 12/13/1970, MRN [id number]  PCP:  Cameron Shackleton, NP-C  Cardiologist:  None  Nephrologist:  Referring MD: Cameron Shackleton, NP-C   CC: Hypertension  History of Present Illness:    Cameron Wells is a 52 y.o. male with a hx of hypertension, hyperlipidemia, coronary calcification, diabetes, asthma, CKD IIIa, OSA on CPAP, and anxiety, here for follow-up. He had initially been diagnosed with hypertension in his 30s. On 11/20/2021 he had seen his PCP and his blood pressure was uncontrolled (172/100) despite a regimen of Losartan-HCTZ 100-25mg  QD, and Toprol 100mg  QD. He was referred to [facility] and seen by Gillian Shields, NP on 01/23/2022. At that visit his blood pressure was 199/123. Losartan-HCTZ and metoprolol were discontinued. He was started on valsartan-HCTZ 320-25 mg daily and carvedilol 25 mg BID. He was referred to [facility].  At his visit 04/2022 carvedilol was reduced from 12.5 mg to due to bradycardia and fatigue. Labs at that visit showed a blood sugar over 500 and he was referred to urgent care and a new PCP. We recommended increasing his insulin. He had a coronary calcium score 02/2022 with a score of 13.7 which was 79th percentile. Renal dopplers were normal. Catecholamines, metanephrines, and cortisol were ordered but never collected. Labs for hyperaldosteronism were indeterminate in 2021.  At his appointment 06/2022 he was feeling well.     Previous antihypertensives:   Past Medical History:  Diagnosis Date   Allergy    Anxiety    Asthma    Coronary artery calcification 08/25/2022   DM (diabetes mellitus) (HCC)    Essential hypertension 06/19/2014   Dx 2010.     Hyperlipidemia    Hypertension    Panic attack     No past surgical history on file.  Current Medications: No outpatient medications have been marked as taking for the 01/13/23 encounter (Appointment) with  Chilton Si, MD.     Allergies:   Penicillins   Social History   Socioeconomic History   Marital status: Married    Spouse name: Not on file   Number of children: Not on file   Years of education: Not on file   Highest education level: Some college, no degree  Occupational History   Not on file  Tobacco Use   Smoking status: Former    Current packs/day: 0.00    Types: Cigarettes    Quit date: 2011    Years since quitting: 13.7   Smokeless tobacco: Never  Vaping Use   Vaping status: Never Used  Substance and Sexual Activity   Alcohol use: Yes    Alcohol/week: 0.0 standard drinks of alcohol   Drug use: No   Sexual activity: Not on file  Other Topics Concern   Not on file  Social History Narrative   Not on file   Social Determinants of Health   Financial Resource Strain: Medium Risk (08/04/2022)   Overall Financial Resource Strain (CARDIA)    Difficulty of Paying Living Expenses: Somewhat hard  Food Insecurity: No Food Insecurity (08/04/2022)   Hunger Vital Sign    Worried About Running Out of Food in the Last Year: Never true    Ran Out of Food in the Last Year: Never true  Transportation Needs: No Transportation Needs (08/04/2022)   PRAPARE - Administrator, Civil Service (Medical): No    Lack of Transportation (Non-Medical): No  Physical Activity: Insufficiently Active (08/04/2022)   Exercise Vital Sign    Days of Exercise per Week: 1 day    Minutes of Exercise per Session: 30 min  Stress: Stress Concern Present (08/04/2022)   [employer] of Occupational Health - Occupational Stress Questionnaire    Feeling of Stress : Very much  Social Connections: Moderately Isolated (08/04/2022)   Social Connection and Isolation Panel [NHANES]    Frequency of Communication with Friends and Family: Once a week    Frequency of Social Gatherings with Friends and Family: More than three times a week    Attends Religious Services: Never    Doctor, general practice or Organizations: No    Attends Engineer, structural: Never    Marital Status: Married     Family History: The patient's family history includes CAD in his maternal grandmother; Cancer in his paternal grandfather; Hypertension in his maternal aunt.  ROS:   Please see the history of present illness.    All other systems reviewed and are negative.  EKGs/Labs/Other Studies Reviewed:    Renal Artery Doppler 03/05/2022: Summary:  Renal:    Right: Normal size right kidney. Normal right Resisitive Index. RRV         flow present. No evidence of right renal artery stenosis.         Normal cortical thickness of right kidney.  Left:  LRV flow present. Normal size of left kidney. Normal left         Resistive Index. Normal cortical thickness of the left         kidney. No evidence of left renal artery stenosis.  Mesenteric:  Normal Superior Mesenteric artery and Celiac artery findings.   CT Cardiac Scoring  02/21/2022: IMPRESSION: Coronary calcium score of 13.7. This was 79th percentile for age-, race-, and sex-matched controls.   Borderline dilated ascending aorta measuring 38mm in diameter at the bifurcation of the main pulmonary artery. Consider 2D echo or gated Chest CTA for further evaluation.  EKG:  EKG is personally reviewed. 07/09/2022: EKG was not ordered.  Recent Labs: 05/01/2022: Magnesium 2.6 11/28/2022: ALT 11; BUN 16; Creatinine, Ser 1.49; Hemoglobin 13.3; Platelets 207.0; Potassium 3.6; Sodium 140; TSH 1.00   Recent Lipid Panel    Component Value Date/Time   CHOL 223 (H) 11/28/2022 1550   CHOL 179 03/05/2022 0903   TRIG 109.0 11/28/2022 1550   HDL 48.00 11/28/2022 1550   HDL 34 (L) 03/05/2022 0903   CHOLHDL 5 11/28/2022 1550   VLDL 21.8 11/28/2022 1550   LDLCALC 153 (H) 11/28/2022 1550   LDLCALC 107 (H) 03/05/2022 0903   LDLDIRECT 145.0 11/20/2021 1136    Physical Exam:    VS:  There were no vitals taken for this visit. , BMI There is no  height or weight on file to calculate BMI. GENERAL:  Well appearing HEENT: Pupils equal round and reactive, fundi not visualized, oral mucosa unremarkable NECK:  No jugular venous distention, waveform within normal limits, carotid upstroke brisk and symmetric, no bruits, no thyromegaly LUNGS:  Clear to auscultation bilaterally HEART:  RRR.  PMI not displaced or sustained,S1 and S2 within normal limits, no S3, no S4, no clicks, no rubs, no murmurs ABD:  Flat, positive bowel sounds normal in frequency in pitch, no bruits, no rebound, no guarding, no midline pulsatile mass, no hepatomegaly, no splenomegaly. Umbilical hernia noted. EXT:  2 plus pulses throughout, no edema, no cyanosis, no clubbing SKIN:  No rashes, no nodules NEURO:  Cranial nerves II through XII grossly intact, motor grossly intact throughout PSYCH:  Cognitively intact, oriented to person place and time   ASSESSMENT/PLAN:    No problem-specific Assessment & Plan notes found for this encounter.    Screening for Secondary Hypertension:     05/01/2022    8:32 PM  Causes  Renovascular HTN Screened  Sleep Apnea Not Screened  Thyroid Disease Screened  Hyperparathyroidism Screened  Coarctation of the Aorta Screened     - Comments BP symmetrical    Relevant Labs/Studies:    Latest Ref Rng & Units 11/28/2022    3:50 PM 08/08/2022   11:38 AM 06/19/2022   12:08 PM  Basic Labs  Sodium 135 - 145 mEq/L 140  140  142   Potassium 3.5 - 5.1 mEq/L 3.6  4.0  4.1   Creatinine 0.40 - 1.50 mg/dL 1.61  0.96  0.45        Latest Ref Rng & Units 11/28/2022    3:50 PM 06/19/2022   12:08 PM  Thyroid   TSH 0.35 - 5.50 uIU/mL 1.00  0.97        Latest Ref Rng & Units 04/27/2019    2:59 PM  Renin/Aldosterone   Aldosterone 0.0 - 30.0 ng/dL 40.9   Renin 8.119 - 1.478 ng/mL/hr 0.236  C  Aldos/Renin Ratio 0.0 - 30.0 97.0  C    C Corrected result             03/05/2022   10:37 AM  Renovascular   Renal Artery [place] Completed Yes       Disposition:    FU with Shelvy Heckert C. Duke Salvia, MD, [facility] in 6 months.  Medication Adjustments/Labs and Tests Ordered: Current medicines are reviewed at length with the patient today.  Concerns regarding medicines are outlined above.   No orders of the defined types were placed in this encounter.  No orders of the defined types were placed in this encounter.  I,Mathew Stumpf,acting as a Neurosurgeon for Chilton Si, MD.,have documented all relevant documentation on the behalf of Chilton Si, MD,as directed by  Chilton Si, MD while in the presence of Chilton Si, MD.  I, Lind Ausley C. Duke Salvia, MD have reviewed all documentation for this visit.  The documentation of the exam, diagnosis, procedures, and orders on 01/13/2023 are all accurate and complete.   Signed, Chilton Si, MD  01/13/2023 8:17 AM    [facility]

## 2023-01-13 NOTE — Progress Notes (Signed)
Advanced [facility]pert[facility] Follow-up:    Date:  01/13/2023   ID:  Cameron Wells, DOB Dec 01, 1970, MRN [id number]  PCP:  Avanell Shackleton, NP-C  Cardiologist:  Chilton Si, MD  Nephrologist:  Referring MD: Avanell Shackleton, NP-C   CC: Hypertension  History of Present Illness:    Cameron Wells is a 52 y.o. male with a hx of hypertension, hyperlipidemia, coronary calcification, diabetes, asthma, CKD IIIa, OSA on CPAP, and anxiety, here for follow-up. He had initially been diagnosed with hypertension in his 30s. On 11/20/2021 he had seen his PCP and his blood pressure was uncontrolled (172/100) despite a regimen of Losartan-HCTZ 100-25mg  QD, and Toprol 100mg  QD. He was referred to the [facility] and seen by Gillian Shields, NP on 01/23/2022. At that visit his blood pressure was 199/123. Losartan-HCTZ and metoprolol were discontinued. He was started on valsartan-HCTZ 320-25 mg daily and carvedilol 25 mg BID. He was referred to [facility].  At his visit 04/2022 carvedilol was reduced from 12.5 mg to due to bradycardia and fatigue. Labs at that visit showed a blood sugar over 500 and he was referred to urgent care and a new PCP. We recommended increasing his insulin. He had a coronary calcium score 02/2022 with a score of 13.7 which was 79th percentile. Renal dopplers were normal. Catecholamines, metanephrines, and cortisol were ordered but never collected. Labs for hyperaldosteronism were indeterminate in 2021.  At his appointment 06/2022 he was feeling well.    Today, he states he is feeling good. In the office his blood pressure is well controlled at 117/80. At home his blood pressure was 135/89 at the highest but mostly averaging in the 120s/80s. Sometimes he feels episodes of palpitations, described as "his heart skips a beat" or his heart is "catching up", which may last for up to 30 minutes. At one time he woke up with palpitations and couldn't catch his breath. He felt his blood rushing and  checked his blood pressure at 139 mmHg. He confirms having known sleep apnea and does use his CPAP. Lately he completes more exercise than he used to, usually dog walking every day for 15-30 minutes. He has not yet started consistent routines. At times he experiences mild dizziness such as when bending over to pick something up. Generally he has been maintaining a low sugar, low carb diet. As of 11/2022 his LDL was 153 and his PCP advised him to restart rosuvastatin 5 mg, which he is currently taking every other day. Every day he will drink up to 4 cups of coffee throughout the day. He denies any chest pain, peripheral edema, headaches, syncope, orthopnea.  Previous antihypertensives:   Past Medical History:  Diagnosis Date   Allergy    Anxiety    Asthma    Coronary artery calcification 08/25/2022   DM (diabetes mellitus) (HCC)    Essential hypertension 06/19/2014   Dx 2010.     Hyperlipidemia    Hypertension    Panic attack     History reviewed. No pertinent surgical history.  Current Medications: Current Meds  Medication Sig   ACCU-CHEK GUIDE test strip USE UP TO FOUR TIMES DAILY AS DIRECTED   Accu-Chek Softclix Lancets lancets SMARTSIG:Topical 1-4 Times Daily   amLODipine (NORVASC) 10 MG tablet Take 1 tablet (10 mg total) by mouth daily.   aspirin EC 81 MG tablet Take 1 tablet (81 mg total) by mouth daily. Swallow whole.   blood glucose meter kit and supplies KIT Dispense based on  patient and insurance preference. Use up to four times daily as directed.   carvedilol (COREG) 12.5 MG tablet Take 1 tablet (12.5 mg total) by mouth 2 (two) times daily.   cetirizine (ZYRTEC) 10 MG tablet TAKE 1 TABLET BY MOUTH ONCE DAILY   EPINEPHrine 0.3 mg/0.3 mL IJ SOAJ injection Inject 0.3 mg into the muscle as needed for anaphylaxis.   fluticasone (FLONASE) 50 MCG/ACT nasal spray Use 2 spray(s) in each nostril once daily   Insulin Glargine (BASAGLAR KWIKPEN) 100 UNIT/ML Inject 12 Units into the skin  at bedtime.   Insulin Pen Needle (PEN NEEDLES) 30G X 8 MM MISC 1 Needle by Does not apply route at bedtime.   metFORMIN (GLUCOPHAGE) 500 MG tablet Take 2 tablets (1,000 mg total) by mouth 2 (two) times daily with a meal.   omeprazole (PRILOSEC OTC) 20 MG tablet Take 20 mg by mouth daily.   rosuvastatin (CRESTOR) 5 MG tablet Take 1 tablet (5 mg total) by mouth daily.   valsartan-hydrochlorothiazide (DIOVAN-HCT) 320-25 MG tablet Take 1 tablet by mouth daily.     Allergies:   Penicillins   Social History   Socioeconomic History   Marital status: Married    Spouse name: Not on file   Number of children: Not on file   Years of education: Not on file   Highest education level: Some college, no degree  Occupational History   Not on file  Tobacco Use   Smoking status: Former    Current packs/day: 0.00    Types: Cigarettes    Quit date: 2011    Years since quitting: 13.7   Smokeless tobacco: Never  Vaping Use   Vaping status: Never Used  Substance and Sexual Activity   Alcohol use: Yes    Alcohol/week: 0.0 standard drinks of alcohol   Drug use: No   Sexual activity: Not on file  Other Topics Concern   Not on file  Social History Narrative   Not on file   Social Determinants of Health   Financial Resource Strain: Medium Risk (08/04/2022)   Overall Financial Resource Strain (CARDIA)    Difficulty of Paying Living Expenses: Somewhat hard  Food Insecurity: No Food Insecurity (08/04/2022)   Hunger Vital Sign    Worried About Running Out of Food in the Last Year: Never true    Ran Out of Food in the Last Year: Never true  Transportation Needs: No Transportation Needs (08/04/2022)   PRAPARE - Administrator, Civil Service (Medical): No    Lack of Transportation (Non-Medical): No  Physical Activity: Insufficiently Active (08/04/2022)   Exercise Vital Sign    Days of Exercise per Week: 1 day    Minutes of Exercise per Session: 30 min  Stress: Stress Concern Present  (08/04/2022)   [employer] of Occupational Health - Occupational Stress Questionnaire    Feeling of Stress : Very much  Social Connections: Moderately Isolated (08/04/2022)   Social Connection and Isolation Panel [NHANES]    Frequency of Communication with Friends and Family: Once a week    Frequency of Social Gatherings with Friends and Family: More than three times a week    Attends Religious Services: Never    Database administrator or Organizations: No    Attends Engineer, structural: Never    Marital Status: Married     Family History: The patient's family history includes CAD in his maternal grandmother; Cancer in his paternal grandfather; Hypertension in his maternal aunt.  ROS:   Please see the history of present illness.    (+) Mild dizziness (+) Occasional palpitations All other systems reviewed and are negative.  EKGs/Labs/Other Studies Reviewed:    Renal Artery Doppler 03/05/2022: Summary:  Renal:    Right: Normal size right kidney. Normal right Resisitive Index. RRV         flow present. No evidence of right renal artery stenosis.         Normal cortical thickness of right kidney.  Left:  LRV flow present. Normal size of left kidney. Normal left         Resistive Index. Normal cortical thickness of the left         kidney. No evidence of left renal artery stenosis.  Mesenteric:  Normal Superior Mesenteric artery and Celiac artery findings.   CT Cardiac Scoring  02/21/2022: IMPRESSION: Coronary calcium score of 13.7. This was 79th percentile for age-, race-, and sex-matched controls.   Borderline dilated ascending aorta measuring 38mm in diameter at the bifurcation of the main pulmonary artery. Consider 2D echo or gated Chest CTA for further evaluation.  EKG:  EKG is personally reviewed. 01/13/2023:  Not ordered. 07/09/2022: EKG was not ordered.  Recent Labs: 05/01/2022: Magnesium 2.6 11/28/2022: ALT 11; BUN 16; Creatinine, Ser 1.49; Hemoglobin  13.3; Platelets 207.0; Potassium 3.6; Sodium 140; TSH 1.00   Recent Lipid Panel    Component Value Date/Time   CHOL 223 (H) 11/28/2022 1550   CHOL 179 03/05/2022 0903   TRIG 109.0 11/28/2022 1550   HDL 48.00 11/28/2022 1550   HDL 34 (L) 03/05/2022 0903   CHOLHDL 5 11/28/2022 1550   VLDL 21.8 11/28/2022 1550   LDLCALC 153 (H) 11/28/2022 1550   LDLCALC 107 (H) 03/05/2022 0903   LDLDIRECT 145.0 11/20/2021 1136    Physical Exam:    VS:  BP 117/80 (BP Location: Left Arm, Patient Position: Sitting, Cuff Size: Large)   Pulse 68   Ht 5\' 11"  (1.803 m)   Wt 201 lb 9.6 oz (91.4 kg)   SpO2 99%   BMI 28.12 kg/m  , BMI Body mass index is 28.12 kg/m. GENERAL:  Well appearing HEENT: Pupils equal round and reactive, fundi not visualized, oral mucosa unremarkable NECK:  No jugular venous distention, waveform within normal limits, carotid upstroke brisk and symmetric, no bruits, no thyromegaly LUNGS:  Clear to auscultation bilaterally HEART:  RRR.  PMI not displaced or sustained,S1 and S2 within normal limits, no S3, no S4, no clicks, no rubs, no murmurs ABD:  Flat, positive bowel sounds normal in frequency in pitch, no bruits, no rebound, no guarding, no midline pulsatile mass, no hepatomegaly, no splenomegaly.  EXT:  2 plus pulses throughout, no edema, no cyanosis, no clubbing SKIN:  No rashes, no nodules NEURO:  Cranial nerves II through XII grossly intact, motor grossly intact throughout PSYCH:  Cognitively intact, oriented to person place and time   ASSESSMENT/PLAN:    # Hypertension Well controlled on Carvedilol, Valsartan, and Hydrochlorothiazide. No significant side effects reported. Patient experiences occasional dizziness with positional changes, likely due to antihypertensive medications. -Continue current medications. -Encourage consistent exercise.  # Hyperlipidemia Recently restarted on Atorvastatin 5mg  every other day. No muscle aches reported. -Continue  Atorvastatin. -Check fasting lipid panel and comprehensive metabolic panel in late November 2024.  # Palpitations Reports occasional palpitations lasting up to 30 minutes. No associated chest pain, shortness of breath, or syncope. -Consider purchase of personal [employer] device for event monitoring. -If palpitations persist or  worsen, consider formal cardiac monitoring.  # General Health Maintenance -Continue daily exercise, low sugar, low carb diet. -Follow up in 1 year or sooner if needed.      Screening for Secondary Hypertension:     05/01/2022    8:32 PM  Causes  Renovascular HTN Screened  Sleep Apnea Not Screened  Thyroid Disease Screened  Hyperparathyroidism Screened  Coarctation of the Aorta Screened     - Comments BP symmetrical    Relevant Labs/Studies:    Latest Ref Rng & Units 11/28/2022    3:50 PM 08/08/2022   11:38 AM 06/19/2022   12:08 PM  Basic Labs  Sodium 135 - 145 mEq/L 140  140  142   Potassium 3.5 - 5.1 mEq/L 3.6  4.0  4.1   Creatinine 0.40 - 1.50 mg/dL 3.66  4.40  3.47        Latest Ref Rng & Units 11/28/2022    3:50 PM 06/19/2022   12:08 PM  Thyroid   TSH 0.35 - 5.50 uIU/mL 1.00  0.97        Latest Ref Rng & Units 04/27/2019    2:59 PM  Renin/Aldosterone   Aldosterone 0.0 - 30.0 ng/dL 42.5   Renin 9.563 - 8.756 ng/mL/hr 0.236  C  Aldos/Renin Ratio 0.0 - 30.0 97.0  C    C Corrected result             03/05/2022   10:37 AM  Renovascular   Renal Artery [place] Completed Yes     Disposition:    FU with Ziggy Reveles C. Duke Salvia, MD, [place] in 1 year.   Medication Adjustments/Labs and Tests Ordered: Current medicines are reviewed at length with the patient today.  Concerns regarding medicines are outlined above.   No orders of the defined types were placed in this encounter.  No orders of the defined types were placed in this encounter.  I,Mathew Stumpf,acting as a Neurosurgeon for Chilton Si, MD.,have documented all relevant documentation on the  behalf of Chilton Si, MD,as directed by  Chilton Si, MD while in the presence of Chilton Si, MD.  I, Lindey Renzulli C. Duke Salvia, MD have reviewed all documentation for this visit.  The documentation of the exam, diagnosis, procedures, and orders on 01/13/2023 are all accurate and complete.  Signed, Chilton Si, MD  01/13/2023 2:05 PM    [facility]

## 2023-01-13 NOTE — Patient Instructions (Signed)
Medication Instructions:  Your physician recommends that you continue on your current medications as directed. Please refer to the Current Medication list given to you   Follow-Up: 1 year with Dr. Duke Salvia    Special Instructions:  Lourena Simmonds for monitoring of EKGs at home: You can look into the [facility] device by [employer].This device is purchased by you and it connects to an application you download to your smart phone.  It can detect abnormal heart rhythms and alert you to contact your doctor for further evaluation. The device is approximately $90 and the phone application is free.  The web site is:  https://www.alivecor.com

## 2023-01-14 ENCOUNTER — Telehealth: Payer: Self-pay | Admitting: Licensed Clinical Social Worker

## 2023-01-14 NOTE — Telephone Encounter (Signed)
H&V Care Navigation CSW Progress Note  Clinical Social Worker contacted patient by phone to f/u after appt yesterday- pt remains uninsured, no returned applications after previous engagement with this Clinical research associate. Called and left voicemail this morning, [phone]. Message disconnected before I could leave my number twice. Will re-attempt again as able.  Patient is participating in a Managed Medicaid Plan:  No, self pay only  SDOH Screenings   Food Insecurity: No Food Insecurity (08/04/2022)  Housing: Low Risk  (08/04/2022)  Transportation Needs: No Transportation Needs (08/04/2022)  Utilities: Not At Risk (01/31/2022)  Alcohol Screen: Medium Risk (08/04/2022)  Depression (PHQ2-9): Low Risk  (11/28/2022)  Financial Resource Strain: Medium Risk (08/04/2022)  Physical Activity: Insufficiently Active (08/04/2022)  Social Connections: Moderately Isolated (08/04/2022)  Stress: Stress Concern Present (08/04/2022)  Tobacco Use: Medium Risk (01/13/2023)    Octavio Graves, MSW, LCSW Clinical Social Worker II [facility] Health Heart/Vascular Care Navigation  [phone]- work cell phone (preferred) [phone]- desk phone

## 2023-01-20 ENCOUNTER — Telehealth: Payer: Self-pay | Admitting: Licensed Clinical Social Worker

## 2023-01-20 NOTE — Telephone Encounter (Signed)
H&V Care Navigation CSW Progress Note  Clinical Social Worker f/u again as pt remains uninsured, no returned applications after previous engagement with this Clinical research associate. Called and left 2nd voicemail this afternoon, [phone]. Will re-attempt again one more time and mail additional applications as able.   Patient is participating in a Managed Medicaid Plan:  No, self pay only    SDOH Screenings   Food Insecurity: No Food Insecurity (08/04/2022)  Housing: Low Risk  (08/04/2022)  Transportation Needs: No Transportation Needs (08/04/2022)  Utilities: Not At Risk (01/31/2022)  Alcohol Screen: Medium Risk (08/04/2022)  Depression (PHQ2-9): Low Risk  (11/28/2022)  Financial Resource Strain: Medium Risk (08/04/2022)  Physical Activity: Insufficiently Active (08/04/2022)  Social Connections: Moderately Isolated (08/04/2022)  Stress: Stress Concern Present (08/04/2022)  Tobacco Use: Medium Risk (01/13/2023)   Octavio Graves, MSW, LCSW Clinical Social Worker II [facility] Health Heart/Vascular Care Navigation  [phone]- work cell phone (preferred) [phone]- desk phone

## 2023-01-23 ENCOUNTER — Telehealth (HOSPITAL_BASED_OUTPATIENT_CLINIC_OR_DEPARTMENT_OTHER): Payer: Self-pay | Admitting: Licensed Clinical Social Worker

## 2023-01-23 NOTE — Telephone Encounter (Signed)
H&V Care Navigation CSW Progress Note  Clinical Social Worker f/u again as pt remains uninsured, no returned applications after previous engagement with this Clinical research associate. Called and left 3rd voicemail this afternoon, [phone]. Will mail assistance applications. Remain available should pt return my calls.    Patient is participating in a Managed Medicaid Plan:  No, self pay only  SDOH Screenings   Food Insecurity: No Food Insecurity (08/04/2022)  Housing: Low Risk  (08/04/2022)  Transportation Needs: No Transportation Needs (08/04/2022)  Utilities: Not At Risk (01/31/2022)  Alcohol Screen: Medium Risk (08/04/2022)  Depression (PHQ2-9): Low Risk  (11/28/2022)  Financial Resource Strain: Medium Risk (08/04/2022)  Physical Activity: Insufficiently Active (08/04/2022)  Social Connections: Moderately Isolated (08/04/2022)  Stress: Stress Concern Present (08/04/2022)  Tobacco Use: Medium Risk (01/13/2023)    Octavio Graves, MSW, LCSW Clinical Social Worker II [facility] Health Heart/Vascular Care Navigation  [phone]- work cell phone (preferred) [phone]- desk phone

## 2023-01-27 NOTE — Telephone Encounter (Signed)
H&V Care Navigation CSW Progress Note  Clinical Social Worker  mailed pt copies of Medicaid expansion flyer, Product/process development scientist for [employer]  as pt remains uninsured and did not answer any of the three calls/voicemails I made. Remain available as needed.  Patient is participating in a Managed Medicaid Plan:  No, self pay only  SDOH Screenings   Food Insecurity: No Food Insecurity (08/04/2022)  Housing: Low Risk  (08/04/2022)  Transportation Needs: No Transportation Needs (08/04/2022)  Utilities: Not At Risk (01/31/2022)  Alcohol Screen: Medium Risk (08/04/2022)  Depression (PHQ2-9): Low Risk  (11/28/2022)  Financial Resource Strain: Medium Risk (08/04/2022)  Physical Activity: Insufficiently Active (08/04/2022)  Social Connections: Moderately Isolated (08/04/2022)  Stress: Stress Concern Present (08/04/2022)  Tobacco Use: Medium Risk (01/13/2023)   Octavio Graves, MSW, LCSW Clinical Social Worker II [facility] Health Heart/Vascular Care Navigation  [phone]- work cell phone (preferred) [phone]- desk phone

## 2023-02-10 ENCOUNTER — Other Ambulatory Visit (HOSPITAL_BASED_OUTPATIENT_CLINIC_OR_DEPARTMENT_OTHER): Payer: Self-pay | Admitting: Family

## 2023-04-14 ENCOUNTER — Other Ambulatory Visit (HOSPITAL_BASED_OUTPATIENT_CLINIC_OR_DEPARTMENT_OTHER): Payer: Self-pay | Admitting: Family

## 2023-04-14 DIAGNOSIS — I10 Essential (primary) hypertension: Secondary | ICD-10-CM

## 2023-04-16 ENCOUNTER — Other Ambulatory Visit: Payer: Self-pay | Admitting: Family Medicine

## 2023-04-16 DIAGNOSIS — Z889 Allergy status to unspecified drugs, medicaments and biological substances status: Secondary | ICD-10-CM

## 2023-04-20 MED ORDER — CETIRIZINE HCL 10 MG PO TABS: ORAL_TABLET | ORAL | 3 refills | Status: DC

## 2023-04-20 NOTE — Addendum Note (Signed)
Addended by: Marinus Maw on: 04/20/2023 08:15 AM   Modules accepted: Orders

## 2023-04-20 NOTE — Telephone Encounter (Signed)
Rx sent 

## 2023-04-20 NOTE — Telephone Encounter (Signed)
Copied from CRM [id number]. Topic: Clinical - Medication Refill >> Apr 17, 2023  4:43 PM Ernst Spell wrote: Most Recent Primary Care Visit:  Provider: Avanell Shackleton  Department: [facility]  Visit Type: OFFICE VISIT  Date: 11/28/2022  Medication: cetirizine (ZYRTEC) 10 MG tablet  Patient called and was told per his pharmacy that his refill request was denied. He stated the pharmacy is also unsure why it was denied. Please call and advise patient with further information.

## 2023-04-21 ENCOUNTER — Telehealth: Payer: Self-pay | Admitting: Family Medicine

## 2023-04-21 DIAGNOSIS — E119 Type 2 diabetes mellitus without complications: Secondary | ICD-10-CM

## 2023-04-21 DIAGNOSIS — Z889 Allergy status to unspecified drugs, medicaments and biological substances status: Secondary | ICD-10-CM

## 2023-04-21 MED ORDER — METFORMIN HCL 500 MG PO TABS
1000.0000 mg | ORAL_TABLET | Freq: Two times a day (BID) | ORAL | 0 refills | Status: DC
Start: 2023-04-21 — End: 2023-07-14

## 2023-04-21 MED ORDER — FLUTICASONE PROPIONATE 50 MCG/ACT NA SUSP
NASAL | 1 refills | Status: DC
Start: 2023-04-21 — End: 2023-10-30

## 2023-04-21 NOTE — Telephone Encounter (Signed)
 Copied from CRM [id number]. Topic: Clinical - Medication Refill >> Apr 21, 2023  9:37 AM Rosina BIRCH wrote: Most Recent Primary Care Visit:  Provider: LENDIA BOBY CROME  Department: [facility]  Visit Type: OFFICE VISIT  Date: 11/28/2022  Medication: metformin  and fluticasone  (pt request 90 day fill and he is out of medication)  Has the patient contacted their pharmacy? No (Agent: If no, request that the patient contact the pharmacy for the refill. If patient does not wish to contact the pharmacy document the reason why and proceed with request.) (Agent: If yes, when and what did the pharmacy advise?)  Is this the correct pharmacy for this prescription? Yes If no, delete pharmacy and type the correct one.  This is the patient's preferred pharmacy:  [facility] [address], [place] - [address] [place] [address] Phone: [phone] Fax: [phone]   Has the prescription been filled recently? No  Is the patient out of the medication? Yes  Has the patient been seen for an appointment in the last year OR does the patient have an upcoming appointment? Yes  Can we respond through MyChart? Yes  Agent: Please be advised that Rx refills may take up to 3 business days. We ask that you follow-up with your pharmacy.

## 2023-04-21 NOTE — Telephone Encounter (Signed)
Rx sent.  Needs appt.

## 2023-04-30 ENCOUNTER — Encounter: Payer: Self-pay | Admitting: Family Medicine

## 2023-04-30 ENCOUNTER — Ambulatory Visit (INDEPENDENT_AMBULATORY_CARE_PROVIDER_SITE_OTHER): Payer: Self-pay | Admitting: Family Medicine

## 2023-04-30 VITALS — BP 120/84 | HR 82 | Temp 97.6°F | Ht 71.0 in | Wt 205.0 lb

## 2023-04-30 DIAGNOSIS — I2510 Atherosclerotic heart disease of native coronary artery without angina pectoris: Secondary | ICD-10-CM

## 2023-04-30 DIAGNOSIS — Z794 Long term (current) use of insulin: Secondary | ICD-10-CM

## 2023-04-30 DIAGNOSIS — E785 Hyperlipidemia, unspecified: Secondary | ICD-10-CM

## 2023-04-30 DIAGNOSIS — E119 Type 2 diabetes mellitus without complications: Secondary | ICD-10-CM

## 2023-04-30 DIAGNOSIS — I10 Essential (primary) hypertension: Secondary | ICD-10-CM

## 2023-04-30 LAB — COMPREHENSIVE METABOLIC PANEL
ALT: 11 U/L (ref 0–53)
AST: 16 U/L (ref 0–37)
Albumin: 4.9 g/dL (ref 3.5–5.2)
Alkaline Phosphatase: 36 U/L — ABNORMAL LOW (ref 39–117)
BUN: 19 mg/dL (ref 6–23)
CO2: 30 meq/L (ref 19–32)
Calcium: 9.8 mg/dL (ref 8.4–10.5)
Chloride: 102 meq/L (ref 96–112)
Creatinine, Ser: 1.21 mg/dL (ref 0.40–1.50)
GFR: 68.3 mL/min (ref >60.00)
Glucose, Bld: 99 mg/dL (ref 70–99)
Potassium: 4 meq/L (ref 3.5–5.1)
Sodium: 141 meq/L (ref 135–145)
Total Bilirubin: 0.7 mg/dL (ref 0.2–1.2)
Total Protein: 8 g/dL (ref 6.0–8.3)

## 2023-04-30 LAB — LIPID PANEL
Cholesterol: 197 mg/dL (ref 0–200)
HDL: 56.6 mg/dL (ref >39.00)
LDL Cholesterol: 124 mg/dL — ABNORMAL HIGH (ref 0–99)
NonHDL: 140.07
Total CHOL/HDL Ratio: 3
Triglycerides: 78 mg/dL (ref 0.0–149.0)
VLDL: 15.6 mg/dL (ref 0.0–40.0)

## 2023-04-30 LAB — HEMOGLOBIN A1C: Hgb A1c MFr Bld: 6 % (ref 4.6–6.5)

## 2023-04-30 NOTE — Progress Notes (Signed)
 Subjective:     Patient ID: Cameron Wells, male    DOB: March 23, 1971, 53 y.o.   MRN: [id number]  Chief Complaint  Patient presents with   Follow-up    F/u for fasting labs    HPI   History of Present Illness         Here for follow up on chronic health conditions.   Reports taking atorvastatin every other day and doing well.   DM- A1c 5.4% in August and he stopped insulin.   Reports taking metformin  1,000 mg bid   FBS at home 99-115   Declines colon cancer screening.     Health Maintenance Due  Topic Date Due   Pneumococcal Vaccine 21-88 Years old (1 of 2 - PCV) Never done   HIV Screening  Never done   Hepatitis C Screening  Never done   Colonoscopy  Never done   Zoster Vaccines- Shingrix (1 of 2) Never done    Past Medical History:  Diagnosis Date   Allergy    Anxiety    Asthma    Coronary artery calcification 08/25/2022   DM (diabetes mellitus) (HCC)    Essential hypertension 06/19/2014   Dx 2010.     Hyperlipidemia    Hypertension    Palpitations 01/13/2023   Panic attack     History reviewed. No pertinent surgical history.  Family History  Problem Relation Age of Onset   CAD Maternal Grandmother    Cancer Paternal Grandfather    Hypertension Maternal Aunt     Social History   Socioeconomic History   Marital status: Married    Spouse name: Not on file   Number of children: Not on file   Years of education: Not on file   Highest education level: Some college, no degree  Occupational History   Not on file  Tobacco Use   Smoking status: Former    Current packs/day: 0.00    Types: Cigarettes    Quit date: 2011    Years since quitting: 14.0   Smokeless tobacco: Never  Vaping Use   Vaping status: Never Used  Substance and Sexual Activity   Alcohol use: Yes    Alcohol/week: 0.0 standard drinks of alcohol   Drug use: No   Sexual activity: Not on file  Other Topics Concern   Not on file  Social History Narrative   Not on file   Social  Drivers of Health   Financial Resource Strain: Medium Risk (04/29/2023)   Overall Financial Resource Strain (CARDIA)    Difficulty of Paying Living Expenses: Somewhat hard  Food Insecurity: No Food Insecurity (04/29/2023)   Hunger Vital Sign    Worried About Running Out of Food in the Last Year: Never true    Ran Out of Food in the Last Year: Never true  Transportation Needs: No Transportation Needs (04/29/2023)   PRAPARE - Administrator, Civil Service (Medical): No    Lack of Transportation (Non-Medical): No  Physical Activity: Sufficiently Active (04/29/2023)   Exercise Vital Sign    Days of Exercise per Week: 3 days    Minutes of Exercise per Session: 60 min  Stress: Stress Concern Present (04/29/2023)   [employer] of Occupational Health - Occupational Stress Questionnaire    Feeling of Stress : To some extent  Social Connections: Socially Isolated (04/29/2023)   Social Connection and Isolation Panel [NHANES]    Frequency of Communication with Friends and Family: Once a week    Frequency  of Social Gatherings with Friends and Family: Never    Attends Religious Services: Never    Database Administrator or Organizations: No    Attends Engineer, Structural: Not on file    Marital Status: Married  Catering Manager Violence: Not on file    Outpatient Medications Prior to Visit  Medication Sig Dispense Refill   ACCU-CHEK GUIDE test strip USE UP TO FOUR TIMES DAILY AS DIRECTED     Accu-Chek Softclix Lancets lancets SMARTSIG:Topical 1-4 Times Daily     amLODipine  (NORVASC ) 10 MG tablet Take 1 tablet by mouth once daily 90 tablet 2   aspirin  EC 81 MG tablet Take 1 tablet (81 mg total) by mouth daily. Swallow whole. 90 tablet 3   blood glucose meter kit and supplies KIT Dispense based on patient and insurance preference. Use up to four times daily as directed. 1 each 0   cetirizine  (ZYRTEC ) 10 MG tablet TAKE 1 TABLET BY MOUTH ONCE DAILY 90 tablet 3   EPINEPHrine  0.3  mg/0.3 mL IJ SOAJ injection Inject 0.3 mg into the muscle as needed for anaphylaxis. 1 each 0   fluticasone  (FLONASE ) 50 MCG/ACT nasal spray Use 2 spray(s) in each nostril once daily 16 g 1   Insulin Glargine  (BASAGLAR  KWIKPEN) 100 UNIT/ML Inject 12 Units into the skin at bedtime. 15 mL 0   Insulin Pen Needle (PEN NEEDLES) 30G X 8 MM MISC 1 Needle by Does not apply route at bedtime. 100 each 0   metFORMIN  (GLUCOPHAGE ) 500 MG tablet Take 2 tablets (1,000 mg total) by mouth 2 (two) times daily with a meal. 360 tablet 0   omeprazole (PRILOSEC OTC) 20 MG tablet Take 20 mg by mouth daily.     rosuvastatin  (CRESTOR ) 5 MG tablet Take 1 tablet (5 mg total) by mouth daily. 90 tablet 1   valsartan -hydrochlorothiazide  (DIOVAN -HCT) 320-25 MG tablet Take 1 tablet by mouth once daily 90 tablet 3   carvedilol  (COREG ) 12.5 MG tablet Take 1 tablet (12.5 mg total) by mouth 2 (two) times daily. 180 tablet 3   nitroGLYCERIN  (NITROSTAT ) 0.4 MG SL tablet Place 1 tablet (0.4 mg total) under the tongue every 5 (five) minutes as needed for chest pain. 25 tablet 3   No facility-administered medications prior to visit.    Allergies  Allergen Reactions   Penicillins Anaphylaxis    Review of Systems  Constitutional:  Negative for chills, fever, malaise/fatigue and weight loss.  Eyes:  Negative for blurred vision.  Respiratory:  Negative for shortness of breath.   Cardiovascular:  Negative for chest pain, palpitations and leg swelling.  Gastrointestinal:  Negative for abdominal pain, constipation, diarrhea, nausea and vomiting.  Genitourinary:  Positive for frequency. Negative for dysuria and urgency.  Neurological:  Negative for dizziness, focal weakness and headaches.       Objective:    Physical Exam Constitutional:      General: He is not in acute distress.    Appearance: He is not ill-appearing.  Eyes:     Extraocular Movements: Extraocular movements intact.     Conjunctiva/sclera: Conjunctivae normal.   Cardiovascular:     Rate and Rhythm: Normal rate.  Pulmonary:     Effort: Pulmonary effort is normal.  Musculoskeletal:     Cervical back: Normal range of motion and neck supple.  Skin:    General: Skin is warm and dry.  Neurological:     General: No focal deficit present.     Mental Status: He is  alert and oriented to person, place, and time.  Psychiatric:        Mood and Affect: Mood normal.        Behavior: Behavior normal.        Thought Content: Thought content normal.      BP 120/84 (BP Location: Left Arm, Patient Position: Sitting, Cuff Size: Large)   Pulse 82   Temp 97.6 F (36.4 C) (Temporal)   Ht 5' 11 (1.803 m)   Wt 205 lb (93 kg)   SpO2 99%   BMI 28.59 kg/m  Wt Readings from Last 3 Encounters:  04/30/23 205 lb (93 kg)  01/13/23 201 lb 9.6 oz (91.4 kg)  11/28/22 202 lb (91.6 kg)       Assessment & Plan:   Problem List Items Addressed This Visit     Controlled type 2 diabetes mellitus without complication, with long-term current use of insulin (HCC) - Primary   Continue metformin  1000 mg twice daily.  Continue monitoring blood sugars closely.  Continue with healthy diet and exercise. Check labs including lipids.       Relevant Orders   Hemoglobin A1c (Completed)   Comprehensive metabolic panel (Completed)   Coronary artery calcification   Visit with Dr. Raford. Continue statin and follow up pending fasting lipids.       Relevant Orders   Lipid panel (Completed)   Dyslipidemia   Reports taking statin every other day. Check fasting lipids.       Relevant Orders   Lipid panel (Completed)   Essential hypertension   Controlled. Continue medications. Follow up with cardiology as recommended.       Relevant Orders   Comprehensive metabolic panel (Completed)    I am having Andry Mcgonagle maintain his omeprazole, EPINEPHrine , aspirin  EC, nitroGLYCERIN , carvedilol , blood glucose meter kit and supplies, Pen Needles, Accu-Chek Guide, Accu-Chek Softclix  Lancets, rosuvastatin , Basaglar  KwikPen, valsartan -hydrochlorothiazide , amLODipine , cetirizine , metFORMIN , and fluticasone .  No orders of the defined types were placed in this encounter.

## 2023-04-30 NOTE — Patient Instructions (Signed)
 Please go downstairs for labs.   I will be in touch with results and recommendations.   Let me know if you change your mind about colon cancer screening.

## 2023-05-01 NOTE — Assessment & Plan Note (Signed)
 Continue metformin 1000 mg twice daily.  Continue monitoring blood sugars closely.  Continue with healthy diet and exercise. Check labs including lipids.

## 2023-05-01 NOTE — Assessment & Plan Note (Signed)
 Visit with Dr. Duke Salvia. Continue statin and follow up pending fasting lipids.

## 2023-05-01 NOTE — Assessment & Plan Note (Signed)
 Reports taking statin every other day. Check fasting lipids.

## 2023-05-01 NOTE — Assessment & Plan Note (Signed)
Controlled. Continue medications. Follow up with cardiology as recommended.

## 2023-07-06 ENCOUNTER — Other Ambulatory Visit: Payer: Self-pay

## 2023-07-06 DIAGNOSIS — E785 Hyperlipidemia, unspecified: Secondary | ICD-10-CM

## 2023-07-06 MED ORDER — ROSUVASTATIN CALCIUM 5 MG PO TABS: 5.0000 mg | ORAL_TABLET | Freq: Every day | ORAL | 1 refills | Status: DC

## 2023-07-14 ENCOUNTER — Other Ambulatory Visit: Payer: Self-pay | Admitting: Family Medicine

## 2023-07-14 DIAGNOSIS — E119 Type 2 diabetes mellitus without complications: Secondary | ICD-10-CM

## 2023-07-29 ENCOUNTER — Other Ambulatory Visit (HOSPITAL_BASED_OUTPATIENT_CLINIC_OR_DEPARTMENT_OTHER): Payer: Self-pay | Admitting: Family

## 2023-10-29 ENCOUNTER — Other Ambulatory Visit: Payer: Self-pay | Admitting: Family Medicine

## 2023-10-29 DIAGNOSIS — Z889 Allergy status to unspecified drugs, medicaments and biological substances status: Secondary | ICD-10-CM

## 2023-11-03 ENCOUNTER — Other Ambulatory Visit: Payer: Self-pay | Admitting: Family Medicine

## 2023-11-03 DIAGNOSIS — E119 Type 2 diabetes mellitus without complications: Secondary | ICD-10-CM

## 2024-01-01 ENCOUNTER — Encounter (HOSPITAL_BASED_OUTPATIENT_CLINIC_OR_DEPARTMENT_OTHER): Payer: Self-pay | Admitting: Cardiovascular Disease

## 2024-01-02 ENCOUNTER — Other Ambulatory Visit: Payer: Self-pay | Admitting: Family Medicine

## 2024-01-02 DIAGNOSIS — E785 Hyperlipidemia, unspecified: Secondary | ICD-10-CM

## 2024-01-16 ENCOUNTER — Other Ambulatory Visit (HOSPITAL_BASED_OUTPATIENT_CLINIC_OR_DEPARTMENT_OTHER): Payer: Self-pay | Admitting: Family

## 2024-01-16 DIAGNOSIS — I10 Essential (primary) hypertension: Secondary | ICD-10-CM

## 2024-01-18 ENCOUNTER — Other Ambulatory Visit: Payer: Self-pay | Admitting: Cardiovascular Disease

## 2024-01-18 MED ORDER — VALSARTAN-HYDROCHLOROTHIAZIDE 320-25 MG PO TABS: 1.0000 | ORAL_TABLET | Freq: Every day | ORAL | 0 refills | Status: DC

## 2024-01-21 ENCOUNTER — Other Ambulatory Visit: Payer: Self-pay | Admitting: Family Medicine

## 2024-01-21 DIAGNOSIS — E119 Type 2 diabetes mellitus without complications: Secondary | ICD-10-CM

## 2024-01-22 ENCOUNTER — Other Ambulatory Visit (HOSPITAL_BASED_OUTPATIENT_CLINIC_OR_DEPARTMENT_OTHER): Payer: Self-pay | Admitting: Family

## 2024-02-05 ENCOUNTER — Encounter: Payer: Self-pay | Admitting: Family Medicine

## 2024-02-05 ENCOUNTER — Ambulatory Visit: Payer: Self-pay | Admitting: Family Medicine

## 2024-02-05 ENCOUNTER — Ambulatory Visit (INDEPENDENT_AMBULATORY_CARE_PROVIDER_SITE_OTHER): Payer: Self-pay | Admitting: Family Medicine

## 2024-02-05 VITALS — BP 102/74 | HR 65 | Temp 97.6°F | Ht 71.0 in | Wt 204.0 lb

## 2024-02-05 DIAGNOSIS — E785 Hyperlipidemia, unspecified: Secondary | ICD-10-CM

## 2024-02-05 DIAGNOSIS — R42 Dizziness and giddiness: Secondary | ICD-10-CM

## 2024-02-05 DIAGNOSIS — Z7984 Long term (current) use of oral hypoglycemic drugs: Secondary | ICD-10-CM

## 2024-02-05 DIAGNOSIS — E1129 Type 2 diabetes mellitus with other diabetic kidney complication: Secondary | ICD-10-CM

## 2024-02-05 DIAGNOSIS — R21 Rash and other nonspecific skin eruption: Secondary | ICD-10-CM

## 2024-02-05 DIAGNOSIS — R002 Palpitations: Secondary | ICD-10-CM

## 2024-02-05 LAB — COMPREHENSIVE METABOLIC PANEL WITH GFR
ALT: 13 U/L (ref 0–53)
AST: 19 U/L (ref 0–37)
Albumin: 5.1 g/dL (ref 3.5–5.2)
Alkaline Phosphatase: 44 U/L (ref 39–117)
BUN: 14 mg/dL (ref 6–23)
CO2: 27 meq/L (ref 19–32)
Calcium: 9.8 mg/dL (ref 8.4–10.5)
Chloride: 103 meq/L (ref 96–112)
Creatinine, Ser: 1.3 mg/dL (ref 0.40–1.50)
GFR: 62.32 mL/min (ref >60.00)
Glucose, Bld: 81 mg/dL (ref 70–99)
Potassium: 3.8 meq/L (ref 3.5–5.1)
Sodium: 141 meq/L (ref 135–145)
Total Bilirubin: 0.7 mg/dL (ref 0.2–1.2)
Total Protein: 8.1 g/dL (ref 6.0–8.3)

## 2024-02-05 LAB — CBC WITH DIFFERENTIAL/PLATELET
Basophils Absolute: 0 K/uL (ref 0.0–0.1)
Basophils Relative: 0.9 % (ref 0.0–3.0)
Eosinophils Absolute: 0.3 K/uL (ref 0.0–0.7)
Eosinophils Relative: 6.6 % — ABNORMAL HIGH (ref 0.0–5.0)
HCT: 42.2 % (ref 39.0–52.0)
Hemoglobin: 14.2 g/dL (ref 13.0–17.0)
Lymphocytes Relative: 30 % (ref 12.0–46.0)
Lymphs Abs: 1.4 K/uL (ref 0.7–4.0)
MCHC: 33.6 g/dL (ref 30.0–36.0)
MCV: 91.6 fl (ref 78.0–100.0)
Monocytes Absolute: 0.4 K/uL (ref 0.1–1.0)
Monocytes Relative: 8.5 % (ref 3.0–12.0)
Neutro Abs: 2.6 K/uL (ref 1.4–7.7)
Neutrophils Relative %: 54 % (ref 43.0–77.0)
Platelets: 194 K/uL (ref 150.0–400.0)
RBC: 4.61 Mil/uL (ref 4.22–5.81)
RDW: 13.3 % (ref 11.5–15.5)
WBC: 4.8 K/uL (ref 4.0–10.5)

## 2024-02-05 LAB — TSH: TSH: 1.31 u[IU]/mL (ref 0.35–5.50)

## 2024-02-05 LAB — MICROALBUMIN / CREATININE URINE RATIO
Creatinine,U: 124.6 mg/dL
Microalb Creat Ratio: 10.1 mg/g (ref 0.0–30.0)
Microalb, Ur: 1.3 mg/dL (ref 0.0–1.9)

## 2024-02-05 LAB — T4, FREE: Free T4: 0.78 ng/dL (ref 0.60–1.60)

## 2024-02-05 LAB — LIPID PANEL
Cholesterol: 198 mg/dL (ref 0–200)
HDL: 54.9 mg/dL (ref >39.00)
LDL Cholesterol: 84 mg/dL (ref 0–99)
NonHDL: 142.97
Total CHOL/HDL Ratio: 4
Triglycerides: 295 mg/dL — ABNORMAL HIGH (ref 0.0–149.0)
VLDL: 59 mg/dL — ABNORMAL HIGH (ref 0.0–40.0)

## 2024-02-05 LAB — MAGNESIUM: Magnesium: 2.2 mg/dL (ref 1.5–2.5)

## 2024-02-05 LAB — HEMOGLOBIN A1C: Hgb A1c MFr Bld: 6.2 % (ref 4.6–6.5)

## 2024-02-05 MED ORDER — CLOTRIMAZOLE-BETAMETHASONE 1-0.05 % EX CREA: 1.0000 | TOPICAL_CREAM | Freq: Every day | CUTANEOUS | 0 refills | Status: AC

## 2024-02-05 MED ORDER — VALSARTAN-HYDROCHLOROTHIAZIDE 160-25 MG PO TABS: 1.0000 | ORAL_TABLET | Freq: Every day | ORAL | 1 refills | Status: DC

## 2024-02-05 MED ORDER — ROSUVASTATIN CALCIUM 5 MG PO TABS: 5.0000 mg | ORAL_TABLET | Freq: Every day | ORAL | 1 refills | Status: AC

## 2024-02-05 MED ORDER — METFORMIN HCL 500 MG PO TABS: 500.0000 mg | ORAL_TABLET | Freq: Every day | ORAL | 1 refills | Status: AC

## 2024-02-05 NOTE — Patient Instructions (Addendum)
  Please go downstairs for labs.   Please call to schedule a follow up visit with cardiology. [facility] [phone]  I am reducing your valsartan  dose to 160 mg. No other changes. Continue to monitor your blood pressure and see your cardiologist.   I will be in touch with your results.

## 2024-02-05 NOTE — Progress Notes (Signed)
 Subjective:     Patient ID: Cameron Wells, male    DOB: Sep 28, 1970, 53 y.o.   MRN: [id number]  Chief Complaint  Patient presents with   Medical Management of Chronic Issues    HPI  Discussed the use of AI scribe software for clinical note transcription with the patient, who gave verbal consent to proceed.  History of Present Illness Cameron Wells is a 53 year old male with hypertension, diabetes, and coronary artery calcification who presents for follow-up on chronic health conditions.  Glycemic control - Metformin  dosage decreased to once daily - Morning blood glucose levels range from 120 to 126 mg/dL - Hemoglobin J8r previously improved from 14% to 6%  Cardiac symptoms - Daily palpitations lasting up to 30 minutes, described as a fluttering sensation - Dizziness, especially when bending over - No chest pain, dyspnea, or peripheral edema - Previous mention of the same symptoms at cardiology visit in 12/2022   Hypertension and hyperlipidemia - History of hypertension and hyperlipidemia - Cholesterol levels not at goal during last check in January - No cardiology follow-up since September of the previous year  Coronary artery calcification - History of coronary artery calcification  Chronic kidney disease - Chronic kidney disease stage 3A in past and needs reassessed   Obstructive sleep apnea - Obstructive sleep apnea managed with CPAP  Musculoskeletal pain - Knee joint pain attributed to aging     Health Maintenance Due  Topic Date Due   HIV Screening  Never done   Diabetic kidney evaluation - Urine ACR  Never done   Hepatitis C Screening  Never done   Pneumococcal Vaccine: 50+ Years (1 of 2 - PCV) Never done   Hepatitis B Vaccines 19-59 Average Risk (1 of 3 - 19+ 3-dose series) Never done   Colonoscopy  Never done   Zoster Vaccines- Shingrix (1 of 2) Never done   OPHTHALMOLOGY EXAM  07/31/2023   FOOT EXAM  08/08/2023   HEMOGLOBIN A1C  10/28/2023    Past  Medical History:  Diagnosis Date   Allergy    Anxiety    Asthma    Coronary artery calcification 08/25/2022   DM (diabetes mellitus) (HCC)    Essential hypertension 06/19/2014   Dx 2010.     Hyperlipidemia    Hypertension    Palpitations 01/13/2023   Panic attack     History reviewed. No pertinent surgical history.  Family History  Problem Relation Age of Onset   CAD Maternal Grandmother    Cancer Paternal Grandfather    Hypertension Maternal Aunt     Social History   Socioeconomic History   Marital status: Married    Spouse name: Not on file   Number of children: Not on file   Years of education: Not on file   Highest education level: GED or equivalent  Occupational History   Not on file  Tobacco Use   Smoking status: Former    Current packs/day: 0.00    Types: Cigarettes    Quit date: 2011    Years since quitting: 14.8   Smokeless tobacco: Never  Vaping Use   Vaping status: Never Used  Substance and Sexual Activity   Alcohol use: Yes    Alcohol/week: 0.0 standard drinks of alcohol   Drug use: No   Sexual activity: Not on file  Other Topics Concern   Not on file  Social History Narrative   Not on file   Social Drivers of Health   Financial Resource Strain:  Low Risk  (02/04/2024)   Overall Financial Resource Strain (CARDIA)    Difficulty of Paying Living Expenses: Not very hard  Food Insecurity: No Food Insecurity (02/04/2024)   Hunger Vital Sign    Worried About Running Out of Food in the Last Year: Never true    Ran Out of Food in the Last Year: Never true  Transportation Needs: No Transportation Needs (02/04/2024)   PRAPARE - Administrator, Civil Service (Medical): No    Lack of Transportation (Non-Medical): No  Physical Activity: Insufficiently Active (02/04/2024)   Exercise Vital Sign    Days of Exercise per Week: 2 days    Minutes of Exercise per Session: 30 min  Stress: Stress Concern Present (02/04/2024)   [employer] of  Occupational Health - Occupational Stress Questionnaire    Feeling of Stress: To some extent  Social Connections: Socially Isolated (02/04/2024)   Social Connection and Isolation Panel    Frequency of Communication with Friends and Family: Once a week    Frequency of Social Gatherings with Friends and Family: Once a week    Attends Religious Services: Never    Database administrator or Organizations: No    Attends Engineer, structural: Not on file    Marital Status: Married  Catering manager Violence: Not on file    Outpatient Medications Prior to Visit  Medication Sig Dispense Refill   ACCU-CHEK GUIDE test strip USE UP TO FOUR TIMES DAILY AS DIRECTED     Accu-Chek Softclix Lancets lancets SMARTSIG:Topical 1-4 Times Daily     amLODipine  (NORVASC ) 10 MG tablet Take 1 tablet by mouth once daily 30 tablet 0   aspirin  EC 81 MG tablet Take 1 tablet (81 mg total) by mouth daily. Swallow whole. 90 tablet 3   blood glucose meter kit and supplies KIT Dispense based on patient and insurance preference. Use up to four times daily as directed. 1 each 0   carvedilol  (COREG ) 12.5 MG tablet Take 1 tablet by mouth twice daily 60 tablet 0   cetirizine  (ZYRTEC ) 10 MG tablet TAKE 1 TABLET BY MOUTH ONCE DAILY 90 tablet 3   EPINEPHrine  0.3 mg/0.3 mL IJ SOAJ injection Inject 0.3 mg into the muscle as needed for anaphylaxis. 1 each 0   fluticasone  (FLONASE ) 50 MCG/ACT nasal spray Use 2 spray(s) in each nostril once daily 16 g 0   nitroGLYCERIN  (NITROSTAT ) 0.4 MG SL tablet Place 1 tablet (0.4 mg total) under the tongue every 5 (five) minutes as needed for chest pain. 25 tablet 3   omeprazole (PRILOSEC OTC) 20 MG tablet Take 20 mg by mouth daily.     Insulin Pen Needle (PEN NEEDLES) 30G X 8 MM MISC 1 Needle by Does not apply route at bedtime. 100 each 0   metFORMIN  (GLUCOPHAGE ) 500 MG tablet TAKE 2 TABLETS BY MOUTH TWICE DAILY WITH A MEAL. Needs follow up with Shataya Winkles for refills. 120 tablet 0    rosuvastatin  (CRESTOR ) 5 MG tablet Take 1 tablet (5 mg total) by mouth daily. Needs appointment for refills 30 tablet 0   valsartan -hydrochlorothiazide  (DIOVAN -HCT) 320-25 MG tablet Take 1 tablet by mouth daily. PATIENT MUST CALL AND SCHEDULE ANNUAL APPOINTMENT FIRST ATTEMPT 30 tablet 0   Insulin Glargine  (BASAGLAR  KWIKPEN) 100 UNIT/ML Inject 12 Units into the skin at bedtime. (Patient not taking: Reported on 02/05/2024) 15 mL 0   No facility-administered medications prior to visit.    Allergies  Allergen Reactions   Penicillins Anaphylaxis  Review of Systems  Constitutional:  Negative for chills, fever and malaise/fatigue.  Respiratory:  Negative for shortness of breath.   Cardiovascular:  Positive for palpitations. Negative for chest pain, claudication and leg swelling.  Gastrointestinal:  Negative for abdominal pain, constipation, diarrhea, nausea and vomiting.  Genitourinary:  Negative for dysuria, frequency and urgency.  Neurological:  Positive for dizziness. Negative for focal weakness, loss of consciousness and headaches.  Psychiatric/Behavioral:  Negative for depression. The patient is not nervous/anxious.        Objective:    Physical Exam Constitutional:      General: He is not in acute distress.    Appearance: He is not ill-appearing.  HENT:     Mouth/Throat:     Mouth: Mucous membranes are moist.     Pharynx: Oropharynx is clear.  Eyes:     Extraocular Movements: Extraocular movements intact.     Conjunctiva/sclera: Conjunctivae normal.     Pupils: Pupils are equal, round, and reactive to light.  Cardiovascular:     Rate and Rhythm: Normal rate and regular rhythm.  Pulmonary:     Effort: Pulmonary effort is normal.     Breath sounds: Normal breath sounds.  Musculoskeletal:     Cervical back: Normal range of motion and neck supple. No tenderness.     Right lower leg: No edema.     Left lower leg: No edema.  Lymphadenopathy:     Cervical: No cervical  adenopathy.  Skin:    General: Skin is warm and dry.  Neurological:     General: No focal deficit present.     Mental Status: He is alert and oriented to person, place, and time.     Motor: No weakness.     Coordination: Coordination normal.     Gait: Gait normal.  Psychiatric:        Mood and Affect: Mood normal.        Behavior: Behavior normal.        Thought Content: Thought content normal.      BP 102/74   Pulse 65   Temp 97.6 F (36.4 C) (Temporal)   Ht 5' 11 (1.803 m)   Wt 204 lb (92.5 kg)   SpO2 99%   BMI 28.45 kg/m  Wt Readings from Last 3 Encounters:  02/05/24 204 lb (92.5 kg)  04/30/23 205 lb (93 kg)  01/13/23 201 lb 9.6 oz (91.4 kg)        Assessment & Plan:   Problem List Items Addressed This Visit     Dyslipidemia   Relevant Medications   rosuvastatin  (CRESTOR ) 5 MG tablet   Other Relevant Orders   Lipid panel   Palpitations   Relevant Orders   EKG 12-Lead   TSH   T4, free   Magnesium   Other Visit Diagnoses       Type 2 diabetes mellitus with renal manifestations, controlled (HCC)    -  Primary   Relevant Medications   rosuvastatin  (CRESTOR ) 5 MG tablet   metFORMIN  (GLUCOPHAGE ) 500 MG tablet   valsartan -hydrochlorothiazide  (DIOVAN -HCT) 160-25 MG tablet   Other Relevant Orders   CBC with Differential/Platelet   Comprehensive metabolic panel with GFR   Hemoglobin A1c   TSH   T4, free   Microalbumin / creatinine urine ratio     Dizziness       Relevant Orders   CBC with Differential/Platelet   Comprehensive metabolic panel with GFR   TSH   T4, free   Magnesium  Rash of genitalia           Assessment and Plan Assessment & Plan Follow-up of chronic health conditions Follow-up for chronic conditions including type 2 diabetes mellitus, essential hypertension, and hyperlipidemia. Blood pressure management is a concern due to symptomatic hypotension. - Reduce valsartan  from 320 mg to 160 mg. No other changes to HTN medications.  Closely monitor BP at home.  - Schedule follow-up appointment with cardiology, either with Reche Finder or Dr. Annabella Scarce.  Type 2 diabetes mellitus without complication Previously well-controlled with A1c reduced from 14 to 6. Currently, he has reduced metformin  intake from twice daily to once daily, resulting in slightly elevated morning blood glucose levels (120-126 mg/dL).  Essential hypertension with symptomatic hypotension Blood pressure is currently too low, with readings such as 102/74 mmHg. Symptoms include dizziness and palpitations, possibly due to overmedication. No chest pain, shortness of breath, or leg swelling reported. - Reduce valsartan  from 320 mg to 160 mg. No other changes to HTN medications. Closely monitor BP at home.  - Order EKG to assess cardiac function. - Check labs to evaluate current status.  Hyperlipidemia Cholesterol levels were not at goal in January. Current status needs evaluation. - Check cholesterol levels   Palpitations and dizziness Experiencing daily palpitations and dizziness, possibly related to low blood pressure. Palpitations are not new. No recent cardiology follow-up. - Consider more formal cardiac monitoring if palpitations continue. - Order EKG to assess cardiac function. - Check labs to evaluate current status.  EKG shows NSR, rate 63. No Q waves or ischemic pattern. Sinus arrhythmia seen on strip  Rash of genitalia (mentioned after given AVS) - Describes white-ish pruritic rash on the underneath side of penis for 3 months. He has not tried anything for it. Declines exam. Requests medication.  - Lotrisone prescribed. Advised to follow up if not improving or if worsening.    I have discontinued Toshiyuki Labrake's Pen Needles, Basaglar  KwikPen, and valsartan -hydrochlorothiazide . I have also changed his rosuvastatin  and metFORMIN . Additionally, I am having him start on valsartan -hydrochlorothiazide  and clotrimazole-betamethasone. Lastly, I  am having him maintain his omeprazole, EPINEPHrine , aspirin  EC, nitroGLYCERIN , blood glucose meter kit and supplies, Accu-Chek Guide, Accu-Chek Softclix Lancets, cetirizine , fluticasone , amLODipine , and carvedilol .  Meds ordered this encounter  Medications   rosuvastatin  (CRESTOR ) 5 MG tablet    Sig: Take 1 tablet (5 mg total) by mouth daily.    Dispense:  90 tablet    Refill:  1   metFORMIN  (GLUCOPHAGE ) 500 MG tablet    Sig: Take 1 tablet (500 mg total) by mouth daily with breakfast. TAKE 2 TABLETS BY MOUTH TWICE DAILY WITH A MEAL.    Dispense:  90 tablet    Refill:  1   valsartan -hydrochlorothiazide  (DIOVAN -HCT) 160-25 MG tablet    Sig: Take 1 tablet by mouth daily.    Dispense:  30 tablet    Refill:  1    Supervising Provider:   ROLLENE NORRIS A [[id number]]   clotrimazole-betamethasone (LOTRISONE) cream    Sig: Apply 1 Application topically daily.    Dispense:  30 g    Refill:  0    Supervising Provider:   ROLLENE NORRIS A [[id number]]

## 2024-02-18 ENCOUNTER — Other Ambulatory Visit (HOSPITAL_BASED_OUTPATIENT_CLINIC_OR_DEPARTMENT_OTHER): Payer: Self-pay | Admitting: Family

## 2024-02-18 DIAGNOSIS — I10 Essential (primary) hypertension: Secondary | ICD-10-CM

## 2024-03-08 ENCOUNTER — Telehealth: Payer: Self-pay | Admitting: Cardiovascular Disease

## 2024-03-08 DIAGNOSIS — I10 Essential (primary) hypertension: Secondary | ICD-10-CM

## 2024-03-08 MED ORDER — AMLODIPINE BESYLATE 10 MG PO TABS: 10.0000 mg | ORAL_TABLET | Freq: Every day | ORAL | 1 refills | Status: AC

## 2024-03-08 NOTE — Telephone Encounter (Signed)
*  STAT* If patient is at the pharmacy, call can be transferred to refill team.   1. Which medications need to be refilled? (please list name of each medication and dose if known)   amLODipine  (NORVASC ) 10 MG tablet     2. Would you like to learn more about the convenience, safety, & potential cost savings by using the [facility]? No    3. Are you open to using the [facility] (Type [facility]. ). No    4. Which pharmacy/location (including street and city if local pharmacy) is medication to be sent to?[facility] - [place], [place] - [address]    5. Do they need a 30 day or 90 day supply? 90 day    Pt is out of medication and has upcoming appt

## 2024-03-08 NOTE — Telephone Encounter (Signed)
 Pt's medication was sent to pt's pharmacy as requested. Confirmation received.

## 2024-04-05 ENCOUNTER — Other Ambulatory Visit: Payer: Self-pay | Admitting: Family Medicine

## 2024-04-11 ENCOUNTER — Other Ambulatory Visit: Payer: Self-pay | Admitting: Family Medicine

## 2024-04-11 DIAGNOSIS — Z889 Allergy status to unspecified drugs, medicaments and biological substances status: Secondary | ICD-10-CM

## 2024-06-20 ENCOUNTER — Ambulatory Visit (HOSPITAL_BASED_OUTPATIENT_CLINIC_OR_DEPARTMENT_OTHER): Payer: Self-pay | Admitting: Cardiovascular Disease
